# Patient Record
Sex: Female | Born: 1960
Health system: Southern US, Community
[De-identification: ages and names within clinical notes are randomized; demographics above are authoritative.]

## PROBLEM LIST (undated history)

## (undated) DIAGNOSIS — I10 Essential (primary) hypertension: Secondary | ICD-10-CM

## (undated) DIAGNOSIS — Z309 Encounter for contraceptive management, unspecified: Secondary | ICD-10-CM

## (undated) DIAGNOSIS — E669 Obesity, unspecified: Secondary | ICD-10-CM

## (undated) DIAGNOSIS — T7840XA Allergy, unspecified, initial encounter: Secondary | ICD-10-CM

## (undated) HISTORY — DX: Encounter for contraceptive management, unspecified: Z30.9

## (undated) HISTORY — DX: Essential (primary) hypertension: I10

## (undated) HISTORY — DX: Allergy, unspecified, initial encounter: T78.40XA

## (undated) HISTORY — DX: Obesity, unspecified: E66.9

---

## 2001-01-16 ENCOUNTER — Other Ambulatory Visit: Admission: RE | Admit: 2001-01-16 | Discharge: 2001-01-16 | Payer: Self-pay | Admitting: Obstetrics and Gynecology

## 2007-07-13 ENCOUNTER — Ambulatory Visit (HOSPITAL_COMMUNITY): Admission: RE | Admit: 2007-07-13 | Discharge: 2007-07-13 | Payer: Self-pay | Admitting: Obstetrics and Gynecology

## 2009-07-15 ENCOUNTER — Ambulatory Visit (HOSPITAL_COMMUNITY): Admission: RE | Admit: 2009-07-15 | Discharge: 2009-07-15 | Payer: Self-pay | Admitting: Obstetrics & Gynecology

## 2013-01-12 ENCOUNTER — Ambulatory Visit (INDEPENDENT_AMBULATORY_CARE_PROVIDER_SITE_OTHER): Payer: BC Managed Care – PPO | Admitting: Adult Health

## 2013-01-12 ENCOUNTER — Other Ambulatory Visit (HOSPITAL_COMMUNITY)
Admission: RE | Admit: 2013-01-12 | Discharge: 2013-01-12 | Disposition: A | Discharge status: Home / Self Care | Payer: BC Managed Care – PPO | Source: Ambulatory Visit | Attending: Adult Health | Admitting: Adult Health

## 2013-01-12 ENCOUNTER — Encounter (INDEPENDENT_AMBULATORY_CARE_PROVIDER_SITE_OTHER): Payer: Self-pay

## 2013-01-12 ENCOUNTER — Encounter: Payer: Self-pay | Admitting: Adult Health

## 2013-01-12 VITALS — BP 140/90 | HR 78 | Ht 66.0 in | Wt 258.0 lb

## 2013-01-12 DIAGNOSIS — R87810 Cervical high risk human papillomavirus (HPV) DNA test positive: Secondary | ICD-10-CM | POA: Insufficient documentation

## 2013-01-12 DIAGNOSIS — Z124 Encounter for screening for malignant neoplasm of cervix: Secondary | ICD-10-CM | POA: Insufficient documentation

## 2013-01-12 DIAGNOSIS — Z1151 Encounter for screening for human papillomavirus (HPV): Secondary | ICD-10-CM | POA: Insufficient documentation

## 2013-01-12 DIAGNOSIS — Z01419 Encounter for gynecological examination (general) (routine) without abnormal findings: Secondary | ICD-10-CM

## 2013-01-12 DIAGNOSIS — Z309 Encounter for contraceptive management, unspecified: Secondary | ICD-10-CM

## 2013-01-12 DIAGNOSIS — Z1212 Encounter for screening for malignant neoplasm of rectum: Secondary | ICD-10-CM

## 2013-01-12 DIAGNOSIS — I10 Essential (primary) hypertension: Secondary | ICD-10-CM

## 2013-01-12 HISTORY — DX: Encounter for contraceptive management, unspecified: Z30.9

## 2013-01-12 LAB — HEMOCCULT GUIAC POC 1CARD (OFFICE): Fecal Occult Blood, POC: NEGATIVE

## 2013-01-12 MED ORDER — NORETHIN-ETH ESTRAD TRIPHASIC 0.5/0.75/1-35 MG-MCG PO TABS: 1.0000 | ORAL_TABLET | Freq: Every day | ORAL | Status: DC

## 2013-01-12 MED ORDER — HYDROCHLOROTHIAZIDE 12.5 MG PO CAPS: 12.5000 mg | ORAL_CAPSULE | Freq: Every day | ORAL | Status: DC

## 2013-01-12 NOTE — Progress Notes (Signed)
Patient ID: Toni Beard, female   DOB: 08-23-1960, 52 y.o.   MRN: [id number] History of Present Illness: Toni Beard is a 52 year old white female in for pap and physical, she has been having in [place]. Has missed several periods, on OCs.  Current Medications, Allergies, Past Medical History, Past Surgical History, Family History and Social History were reviewed in [employer] record.     Review of Systems: Patient denies any headaches, blurred vision, shortness of breath, chest pain, abdominal pain, problems with bowel movements, urination, or intercourse. No joint pain or mood swings    Physical Exam:BP 140/90  Pulse 78  Ht 5\' 6"  (1.676 m)  Wt 258 lb (117.028 kg)  BMI 41.66 kg/m2 General:  Well developed, well nourished, no acute distress Skin:  Warm and dry Neck:  Midline trachea, normal thyroid Lungs; Clear to auscultation bilaterally Breast:  No dominant palpable mass, retraction, or nipple discharge,has area of folliculitis  Cardiovascular: Regular rate and rhythm Abdomen:  Soft, non tender, no hepatosplenomegaly Pelvic:  External genitalia is normal in appearance, except she has multiple sebaceous cysts in labia.  The vagina is normal in appearance.  The cervix is bulbous.Pap with HPV performed.  Uterus is felt to be normal size, shape, and contour.  No  adnexal masses or tenderness noted. Rectal: Good sphincter tone, no polyps, or hemorrhoids felt.  Hemoccult negative. Extremities:  No swelling or varicosities noted Psych:  No mood changes, alert and cooperative,seems happy   Impression: Yearly gyn exam Hypertension Contraceptive management   Plan: Refilled Triphasil x 6 Refilled Microzide 12.5 mg #90 with 4 refills Physical in 1 year Mammogram yearly Colonoscopy now advised Labs in near future Stop OCs at 52 and stay off x 1 month and check [facility]

## 2013-01-12 NOTE — Patient Instructions (Addendum)
Physical in 1 year Mammogram yearly Colonoscopy now Labs in near future Stop pills at 53 stay off 1 month then come in to check [facility]

## 2013-01-22 ENCOUNTER — Telehealth: Payer: Self-pay | Admitting: Adult Health

## 2013-01-22 NOTE — Telephone Encounter (Signed)
Pt aware of abnormal pap and +HPV and need for colpo

## 2013-01-30 ENCOUNTER — Other Ambulatory Visit: Payer: Self-pay | Admitting: Obstetrics & Gynecology

## 2013-01-30 ENCOUNTER — Ambulatory Visit (INDEPENDENT_AMBULATORY_CARE_PROVIDER_SITE_OTHER): Payer: BC Managed Care – PPO | Admitting: Obstetrics & Gynecology

## 2013-01-30 ENCOUNTER — Encounter: Payer: Self-pay | Admitting: Obstetrics & Gynecology

## 2013-01-30 VITALS — BP 122/80 | Ht 66.0 in | Wt 252.0 lb

## 2013-01-30 DIAGNOSIS — R87810 Cervical high risk human papillomavirus (HPV) DNA test positive: Secondary | ICD-10-CM

## 2013-01-30 DIAGNOSIS — N870 Mild cervical dysplasia: Secondary | ICD-10-CM

## 2013-01-30 DIAGNOSIS — R87610 Atypical squamous cells of undetermined significance on cytologic smear of cervix (ASC-US): Secondary | ICD-10-CM

## 2013-01-30 NOTE — Progress Notes (Signed)
Patient ID: Toni Beard, female   DOB: 01-02-1961, 52 y.o.   MRN: [id number] Pap 01/22/2013   ASC-H  +HPV HR Non smoker No new sexual partners Last pap 18 months ago No history of abnormal pap  Colposcopy Anterior cervix with dense acetowhite epithelium with punctation and mosaicism  Biopsy taken follow up in 1 week

## 2013-02-06 ENCOUNTER — Encounter: Payer: Self-pay | Admitting: Obstetrics & Gynecology

## 2013-02-06 ENCOUNTER — Ambulatory Visit (INDEPENDENT_AMBULATORY_CARE_PROVIDER_SITE_OTHER): Payer: BC Managed Care – PPO | Admitting: Obstetrics & Gynecology

## 2013-02-06 VITALS — BP 110/80 | Wt 251.0 lb

## 2013-02-06 DIAGNOSIS — D069 Carcinoma in situ of cervix, unspecified: Secondary | ICD-10-CM | POA: Insufficient documentation

## 2013-02-06 DIAGNOSIS — N871 Moderate cervical dysplasia: Secondary | ICD-10-CM

## 2013-02-06 NOTE — Progress Notes (Signed)
Patient ID: Toni Beard, female   DOB: [birth year], 52 y.o.   MRN: [id number] Pap 01/22/2013 ASC-H +HPV HR  Non smoker  No new sexual partners  Last pap 18 months ago  No history of abnormal pap  Colposcopy  Anterior cervix with dense acetowhite epithelium with punctation and mosaicism  Biopsy taken follow up in 1 week  Biopsy has returned as high grade dysplasia Will proceed with laser conization

## 2013-02-28 ENCOUNTER — Encounter (HOSPITAL_COMMUNITY): Payer: Self-pay | Admitting: Pharmacy Technician

## 2013-03-06 NOTE — Patient Instructions (Signed)
Your procedure is scheduled on: 03/14/2013  [facility] at  6:15   AM.  Call this number if you have problems the morning of surgery: [phone]   Remember:   Do not drink or eat food:After Midnight.  :  Take these medicines the morning of surgery with A SIP OF WATER: Microzide   Do not wear jewelry, make-up or nail polish.  Do not wear lotions, powders, or perfumes. You may wear deodorant.  Do not shave 48 hours prior to surgery. Men may shave face and neck.  Do not bring valuables to the hospital.  Contacts, dentures or bridgework may not be worn into surgery.  Leave suitcase in the car. After surgery it may be brought to your room.  For patients admitted to the hospital, checkout time is 11:00 AM the day of discharge.   Patients discharged the day of surgery will not be allowed to drive home.    Special Instructions: Shower using CHG 2 nights before surgery and the night before surgery.  If you shower the day of surgery use CHG.  Use special wash - you have one bottle of CHG for all showers.  You should use approximately 1/3 of the bottle for each shower.   Please read over the following fact sheets that you were given: Pain Booklet, MRSA Information, Surgical Site Infection Prevention and Care and Recovery After Surgery  Conization of the Cervix, Care After Refer to this sheet in the next few weeks. These instructions provide you with information on caring for yourself after your procedure. Your health care provider may also give you more specific instructions. Your treatment has been planned according to current medical practices but problems sometimes occur. Call your health care provider if you have any problems or questions after your procedure. WHAT TO EXPECT AFTER THE PROCEDURE After your procedure, it is typical to have the following sensations:  If you had a general anesthetic, you may be groggy for 2 3 hours after the procedure.  You may have cramps (similar to  menstrual cramps) for about 1 week.   You may have a bloody discharge or light to moderate bleeding for 1 2 weeks. The bleeding should not be heavy (for example, it should not soak 1 pad in less than 1 hour).  You may have a black vaginal discharge that looks similar to coffee grounds. This is from the paste that was applied to the cervix to control bleeding. This is normal. Recovery may take up to 3 weeks.  HOME CARE INSTRUCTIONS   Arrange for someone to drive you home after the procedure.  Only take medicines as directed by your health care provider. Do not take aspirin. It can cause bleeding.   Take showers for the first week. Do not take baths, swim, or use hot tubs until your health care provider says it is OK.   Do not douche, use tampons, or have sexual intercourse until your health care provider says it is OK.   Avoid strenuous activities, exercises, and heavy lifting for at least 7 14 days.  You may resume your normal diet unless your health care provider advises you differently.    If you are constipated, you may:  Take a mild laxative as directed by your health care provider.   Add fruit and bran to your diet.   Make sure to drink enough fluids to keep your urine clear or pale yellow.  Keep follow-up appointments with your health care provider. SEEK  MEDICAL CARE IF:   You develop a rash.   You are dizzy or lightheaded.   You feel nauseous.   You develop a bad smelling vaginal discharge. SEEK IMMEDIATE MEDICAL CARE IF:   You have blood clots or bleeding that is heavier than a normal menstrual period (for example, soaking a pad in less than 1 hour) or you develop bright red bleeding.   You have a fever over 101F (38.3C) or persistent symptoms for more than 2 3 days.   You have a fever over 101F (38.3C) and your symptoms suddenly get worse.  You have increasing cramps.   You faint.   You have pain when urinating.  You have bloody urine.    You start vomiting.   Your pain is not relieved with your medicine.   Your have severe or worsening pain. Document Released: 02/08/2005 Document Revised: 10/11/2012 Document Reviewed: 08/04/2012 [facility] Patient Information 2014 [place], [place]. PATIENT INSTRUCTIONS POST-ANESTHESIA  IMMEDIATELY FOLLOWING SURGERY:  Do not drive or operate machinery for the first twenty four hours after surgery.  Do not make any important decisions for twenty four hours after surgery or while taking narcotic pain medications or sedatives.  If you develop intractable nausea and vomiting or a severe headache please notify your doctor immediately.  FOLLOW-UP:  Please make an appointment with your surgeon as instructed. You do not need to follow up with anesthesia unless specifically instructed to do so.  WOUND CARE INSTRUCTIONS (if applicable):  Keep a dry clean dressing on the anesthesia/puncture wound site if there is drainage.  Once the wound has quit draining you may leave it open to air.  Generally you should leave the bandage intact for twenty four hours unless there is drainage.  If the epidural site drains for more than 36-48 hours please call the anesthesia department.  QUESTIONS?:  Please feel free to call your physician or the hospital operator if you have any questions, and they will be happy to assist you.

## 2013-03-07 ENCOUNTER — Encounter (HOSPITAL_COMMUNITY): Payer: Self-pay

## 2013-03-07 ENCOUNTER — Encounter (HOSPITAL_COMMUNITY)
Admission: RE | Admit: 2013-03-07 | Discharge: 2013-03-07 | Disposition: A | Discharge status: Home / Self Care | Payer: BC Managed Care – PPO | Source: Ambulatory Visit | Attending: Obstetrics & Gynecology | Admitting: Obstetrics & Gynecology

## 2013-03-07 DIAGNOSIS — Z01810 Encounter for preprocedural cardiovascular examination: Secondary | ICD-10-CM | POA: Insufficient documentation

## 2013-03-07 DIAGNOSIS — Z01812 Encounter for preprocedural laboratory examination: Secondary | ICD-10-CM | POA: Insufficient documentation

## 2013-03-07 DIAGNOSIS — Z01818 Encounter for other preprocedural examination: Secondary | ICD-10-CM | POA: Insufficient documentation

## 2013-03-07 LAB — COMPREHENSIVE METABOLIC PANEL
ALT: 9 U/L (ref 0–35)
AST: 13 U/L (ref 0–37)
Albumin: 3 g/dL — ABNORMAL LOW (ref 3.5–5.2)
Alkaline Phosphatase: 85 U/L (ref 39–117)
BILIRUBIN TOTAL: 0.3 mg/dL (ref 0.3–1.2)
BUN: 12 mg/dL (ref 6–23)
CALCIUM: 8.9 mg/dL (ref 8.4–10.5)
CHLORIDE: 103 meq/L (ref 96–112)
CO2: 27 meq/L (ref 19–32)
Creatinine, Ser: 0.77 mg/dL (ref 0.50–1.10)
GFR calc Af Amer: >90 mL/min (ref >90)
GFR calc non Af Amer: >90 mL/min (ref >90)
Glucose, Bld: 131 mg/dL — ABNORMAL HIGH (ref 70–99)
Potassium: 3.4 mEq/L — ABNORMAL LOW (ref 3.7–5.3)
SODIUM: 144 meq/L (ref 137–147)
TOTAL PROTEIN: 6.4 g/dL (ref 6.0–8.3)

## 2013-03-07 LAB — URINE MICROSCOPIC-ADD ON

## 2013-03-07 LAB — URINALYSIS, ROUTINE W REFLEX MICROSCOPIC
Bilirubin Urine: NEGATIVE
GLUCOSE, UA: NEGATIVE mg/dL
HGB URINE DIPSTICK: NEGATIVE
Ketones, ur: NEGATIVE mg/dL
NITRITE: NEGATIVE
PH: 6 (ref 5.0–8.0)
PROTEIN: NEGATIVE mg/dL
SPECIFIC GRAVITY, URINE: 1.025 (ref 1.005–1.030)
UROBILINOGEN UA: 0.2 mg/dL (ref 0.0–1.0)

## 2013-03-07 LAB — CBC
HCT: 38.2 % (ref 36.0–46.0)
HEMOGLOBIN: 13.3 g/dL (ref 12.0–15.0)
MCH: 31.5 pg (ref 26.0–34.0)
MCHC: 34.8 g/dL (ref 30.0–36.0)
MCV: 90.5 fL (ref 78.0–100.0)
Platelets: 272 10*3/uL (ref 150–400)
RBC: 4.22 MIL/uL (ref 3.87–5.11)
RDW: 12.9 % (ref 11.5–15.5)
WBC: 7.8 10*3/uL (ref 4.0–10.5)

## 2013-03-07 LAB — HCG, QUANTITATIVE, PREGNANCY: hCG, Beta Chain, Quant, S: 1 m[IU]/mL (ref <5)

## 2013-03-14 ENCOUNTER — Ambulatory Visit (HOSPITAL_COMMUNITY): Payer: BC Managed Care – PPO | Admitting: Anesthesiology

## 2013-03-14 ENCOUNTER — Encounter (HOSPITAL_COMMUNITY): Payer: Self-pay | Admitting: *Deleted

## 2013-03-14 ENCOUNTER — Encounter (HOSPITAL_COMMUNITY)
Admission: RE | Disposition: A | Discharge status: Home / Self Care | Payer: Self-pay | Source: Ambulatory Visit | Attending: Obstetrics & Gynecology

## 2013-03-14 ENCOUNTER — Encounter (HOSPITAL_COMMUNITY): Payer: BC Managed Care – PPO | Admitting: Anesthesiology

## 2013-03-14 ENCOUNTER — Ambulatory Visit (HOSPITAL_COMMUNITY)
Admission: RE | Admit: 2013-03-14 | Discharge: 2013-03-14 | Disposition: A | Discharge status: Home / Self Care | Payer: BC Managed Care – PPO | Source: Ambulatory Visit | Attending: Obstetrics & Gynecology | Admitting: Obstetrics & Gynecology

## 2013-03-14 DIAGNOSIS — N871 Moderate cervical dysplasia: Secondary | ICD-10-CM

## 2013-03-14 DIAGNOSIS — D069 Carcinoma in situ of cervix, unspecified: Secondary | ICD-10-CM | POA: Insufficient documentation

## 2013-03-14 DIAGNOSIS — I10 Essential (primary) hypertension: Secondary | ICD-10-CM | POA: Insufficient documentation

## 2013-03-14 DIAGNOSIS — Z9889 Other specified postprocedural states: Secondary | ICD-10-CM

## 2013-03-14 HISTORY — PX: HOLMIUM LASER APPLICATION: SHX5852

## 2013-03-14 HISTORY — PX: CERVICAL CONIZATION W/BX: SHX1330

## 2013-03-14 SURGERY — CONE BIOPSY, CERVIX
Anesthesia: General

## 2013-03-14 MED ORDER — PROPOFOL 10 MG/ML IV BOLUS
INTRAVENOUS | Status: DC | PRN
  Administered 2013-03-14: 150 mg via INTRAVENOUS
  Administered 2013-03-14: 20 mg via INTRAVENOUS

## 2013-03-14 MED ORDER — HYDROCODONE-ACETAMINOPHEN 5-325 MG PO TABS: 1.0000 | ORAL_TABLET | Freq: Four times a day (QID) | ORAL | Status: DC | PRN

## 2013-03-14 MED ORDER — PROPOFOL 10 MG/ML IV BOLUS
INTRAVENOUS | Status: AC
  Filled 2013-03-14: qty 20

## 2013-03-14 MED ORDER — CEFAZOLIN SODIUM-DEXTROSE 2-3 GM-% IV SOLR
2.0000 g | INTRAVENOUS | Status: AC
  Administered 2013-03-14: 2 g via INTRAVENOUS
  Filled 2013-03-14: qty 50

## 2013-03-14 MED ORDER — KETOROLAC TROMETHAMINE 30 MG/ML IJ SOLN
30.0000 mg | Freq: Once | INTRAMUSCULAR | Status: AC
  Administered 2013-03-14: 30 mg via INTRAVENOUS
  Filled 2013-03-14: qty 1

## 2013-03-14 MED ORDER — WATER FOR IRRIGATION, STERILE IR SOLN
Status: DC | PRN
  Administered 2013-03-14: 1000 mL

## 2013-03-14 MED ORDER — ONDANSETRON HCL 4 MG/2ML IJ SOLN
4.0000 mg | Freq: Once | INTRAMUSCULAR | Status: AC
  Administered 2013-03-14: 4 mg via INTRAVENOUS
  Filled 2013-03-14: qty 2

## 2013-03-14 MED ORDER — FENTANYL CITRATE 0.05 MG/ML IJ SOLN
INTRAMUSCULAR | Status: AC
  Filled 2013-03-14: qty 2

## 2013-03-14 MED ORDER — KETOROLAC TROMETHAMINE 10 MG PO TABS: 10.0000 mg | ORAL_TABLET | Freq: Three times a day (TID) | ORAL | Status: DC | PRN

## 2013-03-14 MED ORDER — LIDOCAINE HCL (CARDIAC) 10 MG/ML IV SOLN
INTRAVENOUS | Status: DC | PRN
  Administered 2013-03-14: 10 mg via INTRAVENOUS

## 2013-03-14 MED ORDER — FERRIC SUBSULFATE 259 MG/GM EX SOLN
CUTANEOUS | Status: AC
  Filled 2013-03-14: qty 8

## 2013-03-14 MED ORDER — FENTANYL CITRATE 0.05 MG/ML IJ SOLN
INTRAMUSCULAR | Status: DC | PRN
  Administered 2013-03-14: 50 ug via INTRAVENOUS
  Administered 2013-03-14 (×4): 25 ug via INTRAVENOUS

## 2013-03-14 MED ORDER — LIDOCAINE HCL (PF) 1 % IJ SOLN
INTRAMUSCULAR | Status: AC
  Filled 2013-03-14: qty 5

## 2013-03-14 MED ORDER — MIDAZOLAM HCL 2 MG/2ML IJ SOLN
1.0000 mg | INTRAMUSCULAR | Status: DC | PRN
Start: 2013-03-14 — End: 2013-03-14
  Administered 2013-03-14: 2 mg via INTRAVENOUS

## 2013-03-14 MED ORDER — FENTANYL CITRATE 0.05 MG/ML IJ SOLN: 25.0000 ug | INTRAMUSCULAR | Status: DC | PRN

## 2013-03-14 MED ORDER — GLYCOPYRROLATE 0.2 MG/ML IJ SOLN
0.2000 mg | Freq: Once | INTRAMUSCULAR | Status: AC
  Administered 2013-03-14: 0.2 mg via INTRAVENOUS
  Filled 2013-03-14: qty 1

## 2013-03-14 MED ORDER — FENTANYL CITRATE 0.05 MG/ML IJ SOLN
25.0000 ug | INTRAMUSCULAR | Status: AC
Start: 2013-03-14 — End: 2013-03-14
  Administered 2013-03-14: 25 ug via INTRAVENOUS
  Filled 2013-03-14: qty 2

## 2013-03-14 MED ORDER — ONDANSETRON HCL 4 MG/2ML IJ SOLN
4.0000 mg | Freq: Once | INTRAMUSCULAR | Status: DC | PRN
Start: 2013-03-14 — End: 2013-03-14

## 2013-03-14 MED ORDER — ONDANSETRON HCL 8 MG PO TABS: 8.0000 mg | ORAL_TABLET | Freq: Three times a day (TID) | ORAL | Status: DC | PRN

## 2013-03-14 MED ORDER — FERRIC SUBSULFATE SOLN
Status: DC | PRN
  Administered 2013-03-14: 1

## 2013-03-14 MED ORDER — LACTATED RINGERS IV SOLN
INTRAVENOUS | Status: DC
  Administered 2013-03-14: 07:00:00 via INTRAVENOUS

## 2013-03-14 MED ORDER — MIDAZOLAM HCL 2 MG/2ML IJ SOLN
INTRAMUSCULAR | Status: AC
  Filled 2013-03-14: qty 2

## 2013-03-14 SURGICAL SUPPLY — 28 items
APPLICATOR COTTON TIP 6IN STRL (MISCELLANEOUS) ×2 IMPLANT
BAG HAMPER (MISCELLANEOUS) ×2 IMPLANT
CLOTH BEACON ORANGE TIMEOUT ST (SAFETY) ×2 IMPLANT
COAGULATOR SUCT SWTCH 10FR 6 (ELECTROSURGICAL) ×2 IMPLANT
COVER LIGHT HANDLE STERIS (MISCELLANEOUS) ×4 IMPLANT
ELECT REM PT RETURN 9FT ADLT (ELECTROSURGICAL) ×2
ELECTRODE REM PT RTRN 9FT ADLT (ELECTROSURGICAL) ×1 IMPLANT
FORMALIN 10 PREFIL 120ML (MISCELLANEOUS) ×2 IMPLANT
GAUZE SPONGE 4X4 16PLY XRAY LF (GAUZE/BANDAGES/DRESSINGS) ×2 IMPLANT
GLOVE BIOGEL PI IND STRL 7.0 (GLOVE) ×1 IMPLANT
GLOVE BIOGEL PI IND STRL 8 (GLOVE) ×1 IMPLANT
GLOVE BIOGEL PI INDICATOR 7.0 (GLOVE) ×1
GLOVE BIOGEL PI INDICATOR 8 (GLOVE) ×1
GLOVE ECLIPSE 8.0 STRL XLNG CF (GLOVE) ×2 IMPLANT
GOWN STRL REUS W/TWL LRG LVL3 (GOWN DISPOSABLE) ×2 IMPLANT
KIT ROOM TURNOVER APOR (KITS) ×2 IMPLANT
LASER FIBER DISP 1000U (UROLOGICAL SUPPLIES) ×2 IMPLANT
MANIFOLD NEPTUNE II (INSTRUMENTS) ×2 IMPLANT
MARKER SKIN DUAL TIP RULER LAB (MISCELLANEOUS) ×2 IMPLANT
PACK BASIC III (CUSTOM PROCEDURE TRAY) ×1
PACK PERI GYN (CUSTOM PROCEDURE TRAY) ×2 IMPLANT
PACK SRG BSC III STRL LF ECLPS (CUSTOM PROCEDURE TRAY) ×1 IMPLANT
PAD ARMBOARD 7.5X6 YLW CONV (MISCELLANEOUS) ×2 IMPLANT
PREFILTER SMOKE EVAC (FILTER) ×2 IMPLANT
SET BASIN LINEN APH (SET/KITS/TRAYS/PACK) ×2 IMPLANT
TOWEL OR 17X26 4PK STRL BLUE (TOWEL DISPOSABLE) ×2 IMPLANT
TUBING SMOKE EVAC CO2 (TUBING) ×2 IMPLANT
WATER STERILE IRR 1000ML POUR (IV SOLUTION) ×2 IMPLANT

## 2013-03-14 NOTE — Anesthesia Procedure Notes (Signed)
Procedure Name: LMA Insertion Date/Time: 03/14/2013 7:40 AM Performed by: Franco NonesYATES, Anya Murphey S Pre-anesthesia Checklist: Patient identified, Patient being monitored, Emergency Drugs available, Timeout performed and Suction available Patient Re-evaluated:Patient Re-evaluated prior to inductionOxygen Delivery Method: Circle System Utilized Preoxygenation: Pre-oxygenation with 100% oxygen Intubation Type: IV induction Ventilation: Mask ventilation without difficulty LMA: LMA inserted LMA Size: 4.0 Number of attempts: 1 Placement Confirmation: positive ETCO2 and breath sounds checked- equal and bilateral

## 2013-03-14 NOTE — H&P (Signed)
Preoperative History and Physical  Toni Beard is a 53 y.o. G1P1 with No LMP recorded. Patient is not currently having periods (Reason: Oral contraceptives). admitted for a laser conization of the cervix for high grade dysplasia.  Pap 01/22/2013 ASC-H +HPV HR  Non smoker  No new sexual partners  Last pap 18 months ago  No history of abnormal pap  Colposcopy  Anterior cervix with dense acetowhite epithelium with punctation and mosaicism  Biopsy taken follow up in 1 week  Biopsy has returned as high grade dysplasia  Will proceed with laser conization        PMH:    Past Medical History  Diagnosis Date  . Hypertension   . Obesity   . Contraceptive management 01/12/2013    PSH:    History reviewed. No pertinent past surgical history.  POb/GynH:      OB History   Grav Para Term Preterm Abortions TAB SAB Ect Mult Living   1 1        1       SH:   History  Substance Use Topics  . Smoking status: Former Smoker    Types: Cigarettes    Quit date: 03/07/1978  . Smokeless tobacco: Never Used  . Alcohol Use: No    FH:    Family History  Problem Relation Age of Onset  . Diabetes Mother   . Hypertension Mother   . Heart disease Mother   . Hypertension Sister   . Cancer Sister     kidney,lung  . Hypertension Brother   . Cancer Brother     prostate  . Heart disease Brother      Allergies:  Allergies  Allergen Reactions  . Sulfa Antibiotics Nausea And Vomiting    Medications:      Current facility-administered medications:ceFAZolin (ANCEF) IVPB 2 g/50 mL premix, 2 g, Intravenous, On Call to OR, Lazaro ArmsLuther H Eure, MD;  fentaNYL (SUBLIMAZE) injection 25 mcg, 25 mcg, Intravenous, Q10 min, Laurene FootmanLuis Gonzalez, MD, 25 mcg at 03/14/13 [id number];  glycopyrrolate (ROBINUL) injection 0.2 mg, 0.2 mg, Intravenous, Once, Laurene FootmanLuis Gonzalez, MD;  lactated ringers infusion, , Intravenous, Continuous, Laurene FootmanLuis Gonzalez, MD, Last Rate: 75 mL/hr at 03/14/13 [id number] midazolam (VERSED) injection 1-2 mg, 1-2  mg, Intravenous, Q5 Min x 3 PRN, Laurene FootmanLuis Gonzalez, MD, 2 mg at 03/14/13 [id number];  ondansetron [facility](ZOFRAN) injection 4 mg, 4 mg, Intravenous, Once, Laurene FootmanLuis Gonzalez, MD  Review of Systems:   Review of Systems  Constitutional: Negative for fever, chills, weight loss, malaise/fatigue and diaphoresis.  HENT: Negative for hearing loss, ear pain, nosebleeds, congestion, sore throat, neck pain, tinnitus and ear discharge.   Eyes: Negative for blurred vision, double vision, photophobia, pain, discharge and redness.  Respiratory: Negative for cough, hemoptysis, sputum production, shortness of breath, wheezing and stridor.   Cardiovascular: Negative for chest pain, palpitations, orthopnea, claudication, leg swelling and PND.  Gastrointestinal: Negative for abdominal pain. Negative for heartburn, nausea, vomiting, diarrhea, constipation, blood in stool and melena.  Genitourinary: Negative for dysuria, urgency, frequency, hematuria and flank pain.  Musculoskeletal: Negative for myalgias, back pain, joint pain and falls.  Skin: Negative for itching and rash.  Neurological: Negative for dizziness, tingling, tremors, sensory change, speech change, focal weakness, seizures, loss of consciousness, weakness and headaches.  Endo/Heme/Allergies: Negative for environmental allergies and polydipsia. Does not bruise/bleed easily.  Psychiatric/Behavioral: Negative for depression, suicidal ideas, hallucinations, memory loss and substance abuse. The patient is not nervous/anxious and does not have insomnia.      PHYSICAL  EXAM:  Pulse 93, temperature 97.4 F (36.3 C), temperature source Oral, resp. rate 18, height 5\' 6"  (1.676 m), weight 257 lb (116.574 kg), SpO2 100.00%.    Vitals reviewed. Constitutional: She is oriented to person, place, and time. She appears well-developed and well-nourished.  HENT:  Head: Normocephalic and atraumatic.  Right Ear: External ear normal.  Left Ear: External ear normal.  Nose: Nose normal.   Mouth/Throat: Oropharynx is clear and moist.  Eyes: Conjunctivae and EOM are normal. Pupils are equal, round, and reactive to light. Right eye exhibits no discharge. Left eye exhibits no discharge. No scleral icterus.  Neck: Normal range of motion. Neck supple. No tracheal deviation present. No thyromegaly present.  Cardiovascular: Normal rate, regular rhythm, normal heart sounds and intact distal pulses.  Exam reveals no gallop and no friction rub.   No murmur heard. Respiratory: Effort normal and breath sounds normal. No respiratory distress. She has no wheezes. She has no rales. She exhibits no tenderness.  GI: Soft. Bowel sounds are normal. She exhibits no distension and no mass. There is tenderness. There is no rebound and no guarding.  Genitourinary:       Vulva is normal without lesions Vagina is pink moist without discharge Cervix normal in appearance and pap is normal Uterus is normal Adnexa is negative with normal sized ovaries Musculoskeletal: Normal range of motion. She exhibits no edema and no tenderness.  Neurological: She is alert and oriented to person, place, and time. She has normal reflexes. She displays normal reflexes. No cranial nerve deficit. She exhibits normal muscle tone. Coordination normal.  Skin: Skin is warm and dry. No rash noted. No erythema. No pallor.  Psychiatric: She has a normal mood and affect. Her behavior is normal. Judgment and thought content normal.    Labs: Results for orders placed during the hospital encounter of 03/07/13 (from the past 336 hour(s))  URINALYSIS, ROUTINE W REFLEX MICROSCOPIC   Collection Time    03/07/13 10:04 AM      Result Value Range   Color, Urine YELLOW  YELLOW   APPearance CLEAR  CLEAR   Specific Gravity, Urine 1.025  1.005 - 1.030   pH 6.0  5.0 - 8.0   Glucose, UA NEGATIVE  NEGATIVE mg/dL   Hgb urine dipstick NEGATIVE  NEGATIVE   Bilirubin Urine NEGATIVE  NEGATIVE   Ketones, ur NEGATIVE  NEGATIVE mg/dL   Protein,  ur NEGATIVE  NEGATIVE mg/dL   Urobilinogen, UA 0.2  0.0 - 1.0 mg/dL   Nitrite NEGATIVE  NEGATIVE   Leukocytes, UA TRACE (*) NEGATIVE  URINE MICROSCOPIC-ADD ON   Collection Time    03/07/13 10:04 AM      Result Value Range   Squamous Epithelial / LPF RARE  RARE   WBC, UA 0-2  <3 WBC/hpf  CBC   Collection Time    03/07/13 10:21 AM      Result Value Range   WBC 7.8  4.0 - 10.5 K/uL   RBC 4.22  3.87 - 5.11 MIL/uL   Hemoglobin 13.3  12.0 - 15.0 g/dL   HCT 96.0  45.4 - 09.8 %   MCV 90.5  78.0 - 100.0 fL   MCH 31.5  26.0 - 34.0 pg   MCHC 34.8  30.0 - 36.0 g/dL   RDW 11.9  14.7 - 82.9 %   Platelets 272  150 - 400 K/uL  COMPREHENSIVE METABOLIC PANEL   Collection Time    03/07/13 10:21 AM  Result Value Range   Sodium 144  137 - 147 mEq/L   Potassium 3.4 (*) 3.7 - 5.3 mEq/L   Chloride 103  96 - 112 mEq/L   CO2 27  19 - 32 mEq/L   Glucose, Bld 131 (*) 70 - 99 mg/dL   BUN 12  6 - 23 mg/dL   Creatinine, Ser 1.61  0.50 - 1.10 mg/dL   Calcium 8.9  8.4 - 09.6 mg/dL   Total Protein 6.4  6.0 - 8.3 g/dL   Albumin 3.0 (*) 3.5 - 5.2 g/dL   AST 13  0 - 37 U/L   ALT 9  0 - 35 U/L   Alkaline Phosphatase 85  39 - 117 U/L   Total Bilirubin 0.3  0.3 - 1.2 mg/dL   GFR calc non Af Amer >90  >90 mL/min   GFR calc Af Amer >90  >90 mL/min  HCG, QUANTITATIVE, PREGNANCY   Collection Time    03/07/13 10:21 AM      Result Value Range   hCG, Beta Chain, Quant, S 1  <5 mIU/mL    EKG: Orders placed during the hospital encounter of 03/14/13  . EKG 12-LEAD  . EKG 12-LEAD    Imaging Studies: No results found.    Assessment: Patient Active Problem List   Diagnosis Date Noted  . Carcinoma in situ of cervix uteri 02/06/2013  . Hypertension 01/12/2013  . Contraceptive management 01/12/2013    Plan: Laser conization of the cervix  EURE,LUTHER H 03/14/2013 7:26 AM

## 2013-03-14 NOTE — Anesthesia Preprocedure Evaluation (Signed)
Anesthesia Evaluation  Patient identified by MRN, date of birth, ID band Patient awake    Reviewed: Allergy & Precautions, H&P , NPO status , Patient's Chart, lab work & pertinent test results  Airway Mallampati: II TM Distance: >3 FB Neck ROM: Full    Dental  (+) Teeth Intact   Pulmonary neg pulmonary ROS, former smoker,  breath sounds clear to auscultation        Cardiovascular hypertension, Pt. on medications Rhythm:Regular Rate:Normal     Neuro/Psych    GI/Hepatic negative GI ROS,   Endo/Other    Renal/GU      Musculoskeletal   Abdominal   Peds  Hematology   Anesthesia Other Findings   Reproductive/Obstetrics                           Anesthesia Physical Anesthesia Plan  ASA: II  Anesthesia Plan: General   Post-op Pain Management:    Induction: Intravenous  Airway Management Planned: LMA  Additional Equipment:   Intra-op Plan:   Post-operative Plan: Extubation in OR  Informed Consent: I have reviewed the patients History and Physical, chart, labs and discussed the procedure including the risks, benefits and alternatives for the proposed anesthesia with the patient or authorized representative who has indicated his/her understanding and acceptance.     Plan Discussed with:   Anesthesia Plan Comments:         Anesthesia Quick Evaluation

## 2013-03-14 NOTE — Discharge Instructions (Signed)
Conization of the Cervix Cervical conization is the cutting (excision) of a cone-shaped portion of the cervix. The procedure is performed through the vagina in either your health care provider's office or an operating room. This procedure is usually done when there is abnormal bleeding from the cervix. It can also be done to evaluate an abnormal Pap test or if an abnormality is seen on the cervix during an exam. The tissue is then examined to see if there are precancerous cells or cancer present.  Conization of the cervix is not done during a menstrual period or pregnancy.  LET YOUR HEALTH CARE PROVIDER KNOW ABOUT:  Any allergies you have.   All medicines you are taking, including vitamins, herbs, eye drops, creams, and over-the-counter medicines.   Previous problems you or members of your family have had with the use of anesthetics.   Any blood disorders you have.   Previous surgeries you have had.   Medical conditions you have.   Your smoking habits.   The possibility of being pregnant.  RISKS AND COMPLICATIONS  Generally, conization of the cervix is a safe procedure. However, as with any procedure, complications can occur. Possible complications include:  Heavy bleeding several days or weeks after the procedure. Light bleeding or spotting after the procedure is normal.  Infection (rare).  Damage to the cervix or surrounding organs (uncommon).   Problems with the anesthesia.   Increased risk of preterm labor in future pregnancies. BEFORE THE PROCEDURE  Do not eat or drink anything for 6 8 hours before the procedure.   Do not take aspirin or blood thinners for at least a week before the procedure or as directed by your health care provider.   Arrange for someone to take you home after the procedure.  PROCEDURE There are three different methods to perform conization of the cervix. These include:   The cold knife method In this method a small cone-shaped sample  of tissue is cut out with a knife (scalpel) from the cervical canal and the transformation zone (where the normal cells end and the abnormal cells begin).   The LEEP method In this method a small cone-shaped sample of tissue is cut out with a thin wire that can burn (cauterize) the cervical tissue with an electrical current.   Laser treatment In this method a small cone-shaped sample of tissue is cut out and then cauterized with a laser beam to prevent bleeding.  The procedure will be performed as follows:   Depending on the method, you will either be given a medicine to make you sleep (general anesthetic) or a numbing medicine (local anesthetic). A medicine that numbs the cervix (cervical block) may be given.   A lubricated device called a speculum will be inserted into the vagina to spread open the walls of the vagina. This will help your health care provider see the inside of the vagina and cervix better.   The tissue from the cervix will be removed and examined.   The results of the procedure will help your health care provider decide if further treatment is necessary. They will also help your health care provider decide on the best treatment if your results are abnormal. AFTER THE PROCEDURE  If you had a general anesthetic, you may be groggy for 2 3 hours after the procedure.   If you had a local anesthetic, you will rest at the clinic or hospital until you are stable and feel ready to go home.   Recovery may   take up to 3 weeks.   You may have some cramping for about 1 week.   You may have bloody discharge or light bleeding for 1 2 weeks.   You may have black discharge coming from the vagina. This is from the paste used on the cervix to prevent bleeding. This is normal discharge.  Document Released: 11/18/2004 Document Revised: 10/11/2012 Document Reviewed: 08/04/2012 [employer] Patient Information 2014 [employer]  

## 2013-03-14 NOTE — Op Note (Signed)
Preoperative diagnosis:  1.  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  However her last Pap returned as atypical squamous cells cannot rule out high-grade dysplasia lesion.  I performed a colposcopy in the office and found a very large cervical lesion.  A biopsy returned revealing high-grade dysplasia with involvement of the underlying endocervical glands.  As a result we decided to proceed with a conization of the cervix for both therapeutic and diagnostic reasons.  Also the lesion was quite large and extensive.  Today at the time of surgery a repeat colposcopy was performed using 3% acetic acid and the lesion was once again confirmed.  There  were no new findings today.  Description of operation:  Patient was taken to the operating room and placed in the supine position where she underwent laryngeal mask airway anesthesia.  She was then placed in the high lithotomy position using candy cane stirrups.  She was then draped out for a laser procedure.  The microscope was used and 3% acetic acid was placed on the cervix.  The holmium laser was then employed at a power of 2.5 and rates between 8 and 15.  I achieved a couple millimeter margin around lesions both at 12:00 and 6:00 the laser was used to perform a conization.  The specimen was removed and sent to pathology for evaluation.  As is always the case with laser I did achieve at an appropriate margin around the disease with shrinkage of the tissue during the procedure it may appear to be a positive lateral margin.  However the surgical margin is indeed clear.  I then used the laser to ablate the conization bed to a  depth of 5-7 mm laterally coning down to 9 mm centrally and began getting good surgical margin.  Additional hemostasis was achieved using Monsel solution.  In the conization bed was completely hemostatic.  Blood loss for the procedure was none.  The patient received 2 grams Ancef and toradol  The patient was awakened from anesthesia and taken to the recovery room in good stable condition with all counts being correct.  She will be followed up in the office in one month for evaluation of the conization bed.  EURE,LUTHER H 03/14/2013 8:42 AM

## 2013-03-14 NOTE — Transfer of Care (Signed)
Immediate Anesthesia Transfer of Care Note  Patient: Toni Beard  Procedure(s) Performed: Procedure(s) (LRB): CONIZATION CERVIX  (N/A) HOLMIUM LASER APPLICATION (N/A)  Patient Location: PACU  Anesthesia Type: General  Level of Consciousness: awake  Airway & Oxygen Therapy: Patient Spontanous Breathing and non-rebreather face mask  Post-op Assessment: Report given to PACU RN, Post -op Vital signs reviewed and stable and Patient moving all extremities  Post vital signs: Reviewed and stable  Complications: No apparent anesthesia complications

## 2013-03-14 NOTE — Anesthesia Postprocedure Evaluation (Signed)
Anesthesia Post Note  Patient: Toni Beard  Procedure(s) Performed: Procedure(s) (LRB): CONIZATION CERVIX  (N/A) HOLMIUM LASER APPLICATION (N/A)  Anesthesia type: General  Patient location: PACU  Post pain: Pain level controlled  Post assessment: Post-op Vital signs reviewed, Patient's Cardiovascular Status Stable, Respiratory Function Stable, Patent Airway, No signs of Nausea or vomiting and Pain level controlled  Last Vitals:  Filed Vitals:   03/14/13 0840  BP: 106/83  Pulse: 101  Temp: 36.5 C  Resp: 14    Post vital signs: Reviewed and stable  Level of consciousness: awake and alert   Complications: No apparent anesthesia complications

## 2013-03-15 ENCOUNTER — Encounter (HOSPITAL_COMMUNITY): Payer: Self-pay | Admitting: Obstetrics & Gynecology

## 2013-03-21 ENCOUNTER — Encounter: Payer: Self-pay | Admitting: Obstetrics & Gynecology

## 2013-03-21 ENCOUNTER — Ambulatory Visit (INDEPENDENT_AMBULATORY_CARE_PROVIDER_SITE_OTHER): Payer: Self-pay | Admitting: Obstetrics & Gynecology

## 2013-03-21 VITALS — BP 150/90 | Wt 253.0 lb

## 2013-03-21 DIAGNOSIS — Z9889 Other specified postprocedural states: Secondary | ICD-10-CM

## 2013-03-21 DIAGNOSIS — D069 Carcinoma in situ of cervix, unspecified: Secondary | ICD-10-CM

## 2013-03-21 NOTE — Progress Notes (Signed)
Patient ID: Wende CreaseSusan J Gutter, female   DOB: 19-Mar-1960, 53 y.o.   MRN: [id number] POD #8 laser conization of the cervix  patholgy HSIL with endocerivcal glandular involvement margins are clear  Exam Normal post op monsel's placed  Follow up pap in 6 months

## 2013-09-19 ENCOUNTER — Other Ambulatory Visit: Payer: BC Managed Care – PPO | Admitting: Obstetrics & Gynecology

## 2013-09-24 ENCOUNTER — Other Ambulatory Visit (HOSPITAL_COMMUNITY)
Admission: RE | Admit: 2013-09-24 | Discharge: 2013-09-24 | Disposition: A | Discharge status: Home / Self Care | Payer: BC Managed Care – PPO | Source: Ambulatory Visit | Attending: Obstetrics & Gynecology | Admitting: Obstetrics & Gynecology

## 2013-09-24 ENCOUNTER — Ambulatory Visit (INDEPENDENT_AMBULATORY_CARE_PROVIDER_SITE_OTHER): Payer: BC Managed Care – PPO | Admitting: Obstetrics & Gynecology

## 2013-09-24 ENCOUNTER — Encounter: Payer: Self-pay | Admitting: Obstetrics & Gynecology

## 2013-09-24 VITALS — BP 122/78 | Ht 66.0 in | Wt 251.5 lb

## 2013-09-24 DIAGNOSIS — N871 Moderate cervical dysplasia: Secondary | ICD-10-CM

## 2013-09-24 DIAGNOSIS — Z01419 Encounter for gynecological examination (general) (routine) without abnormal findings: Secondary | ICD-10-CM | POA: Diagnosis present

## 2013-09-24 NOTE — Progress Notes (Signed)
Patient ID: Toni CreaseSusan J Beard, female   DOB: 1960/04/28, 53 y.o.   MRN: [id number] Chief Complaint  Patient presents with  . Repeat pap    03/14/13 cervix cone moderate dysplasia, bloodwork for menapause    HPI Status post laser conization of cervix 02/2013 Pathology:  HSIL with endocerviacal glandular involvement  ROS No burning with urination, frequency or urgency No nausea, vomiting or diarrhea Nor fever chills or other constitutional symptoms   Blood pressure 122/78, height 5\' 6"  (1.676 m), weight 251 lb 8 oz (114.08 kg).  EXAM Abdomen:      soft, nontender Vulva:            normal appearing vulva with no masses, tenderness or lesions Vagina:          normal mucosa, no discharge Cervix:           normal appearance, thin prep PAP obtained and s/p laser conization Uterus:           Adnexa:          Rectal:            Hemoccult:                               Assessment/Plan:  HSIL s/p laser conization of cervix  Repeat pap in 6 months

## 2013-09-26 LAB — CYTOLOGY - PAP

## 2013-12-24 ENCOUNTER — Encounter: Payer: Self-pay | Admitting: Obstetrics & Gynecology

## 2014-03-01 ENCOUNTER — Encounter: Payer: Self-pay | Admitting: Family Medicine

## 2014-03-01 DIAGNOSIS — J302 Other seasonal allergic rhinitis: Secondary | ICD-10-CM | POA: Insufficient documentation

## 2014-05-17 ENCOUNTER — Encounter: Payer: Self-pay | Admitting: Family Medicine

## 2014-05-17 ENCOUNTER — Ambulatory Visit (INDEPENDENT_AMBULATORY_CARE_PROVIDER_SITE_OTHER): Payer: 59 | Admitting: Family Medicine

## 2014-05-17 VITALS — BP 128/74 | HR 76 | Temp 97.8°F | Resp 14 | Ht 66.0 in | Wt 230.0 lb

## 2014-05-17 DIAGNOSIS — Z0000 Encounter for general adult medical examination without abnormal findings: Secondary | ICD-10-CM | POA: Diagnosis not present

## 2014-05-17 DIAGNOSIS — Z23 Encounter for immunization: Secondary | ICD-10-CM

## 2014-05-17 DIAGNOSIS — I10 Essential (primary) hypertension: Secondary | ICD-10-CM | POA: Diagnosis not present

## 2014-05-17 DIAGNOSIS — J302 Other seasonal allergic rhinitis: Secondary | ICD-10-CM

## 2014-05-17 DIAGNOSIS — E669 Obesity, unspecified: Secondary | ICD-10-CM

## 2014-05-17 DIAGNOSIS — H9202 Otalgia, left ear: Secondary | ICD-10-CM | POA: Diagnosis not present

## 2014-05-17 DIAGNOSIS — Z1321 Encounter for screening for nutritional disorder: Secondary | ICD-10-CM

## 2014-05-17 DIAGNOSIS — Z1239 Encounter for other screening for malignant neoplasm of breast: Secondary | ICD-10-CM | POA: Diagnosis not present

## 2014-05-17 MED ORDER — HYDROCHLOROTHIAZIDE 12.5 MG PO CAPS
12.5000 mg | ORAL_CAPSULE | Freq: Every day | ORAL | Status: DC
Start: 2014-05-17 — End: 2015-04-02

## 2014-05-17 MED ORDER — CETIRIZINE HCL 10 MG PO TABS: 10.0000 mg | ORAL_TABLET | Freq: Every day | ORAL | Status: DC

## 2014-05-17 NOTE — Assessment & Plan Note (Signed)
Return for fasting labs, family history of heart disease Healthy eating and weight loss needed

## 2014-05-17 NOTE — Assessment & Plan Note (Signed)
Her otalgia may be coming from some of her sinus pressure as well as some fluid. Have her use Sudafed for a few days. We will also start Zyrtec at bedtime. In general her hearing exam was okay. She has persistent symptoms despite this treatment then I will refer her to ear nose and throat.

## 2014-05-17 NOTE — Assessment & Plan Note (Signed)
Well controlled, no change to meds 

## 2014-05-17 NOTE — Patient Instructions (Addendum)
Take allergy medication as prescribed Use Sudafed for next 3-4 days to decongest, call if not better and you will be referred to ENT Set up Mammogram - call number provided Return for fasting labs  TDAP given F/U 4 months for blood pressure

## 2014-05-17 NOTE — Progress Notes (Signed)
Patient ID: Toni CreaseSusan J Beard, female   DOB: 1960/08/15, 54 y.o.   MRN: [id number]   Subjective:    Patient ID: Toni CreaseSusan J Bartley, female    DOB: 1960/08/15, 54 y.o.   MRN: [id number]  Patient presents for New Patient- Establish Care and Illness  Pt here to establish care/ CPE, she has not had a primary care provider in a few years. [facility]. History of hypertension she has been on hydrochlorothiazide 12.5 mg with good control. She has no history of any heart disease. No history of any lung disease. She is not diabetic no hyperlipidemia.  She does suffer with seasonal allergies. For the past couple weeks she has had a tickle in her throat as well as some sneezing and drainage she's also had some left ear discomfort more of a muffling of her ear instead of true pain. She has not had any discharge from the ear. No fever.   Her Pap smears up-to-date she has history of cervical cancer which was treated. She is due for mammogram she is also due for colonoscopy will once hold off on this.  Immunizations reviewed she is due for tetanus booster.  Has house cleaning business She is due for fasting labs Review Of Systems:  GEN- denies fatigue, fever, weight loss,weakness, recent illness HEENT- denies eye drainage, change in vision,+ nasal discharge, CVS- denies chest pain, palpitations RESP- denies SOB, cough, wheeze ABD- denies N/V, change in stools, abd pain GU- denies dysuria, hematuria, dribbling, incontinence MSK- denies joint pain, muscle aches, injury Neuro- denies headache, dizziness, syncope, seizure activity       Objective:    BP 128/74 mmHg  Pulse 76  Temp(Src) 97.8 F (36.6 C) (Oral)  Resp 14  Ht 5\' 6"  (1.676 m)  Wt 230 lb (104.327 kg)  BMI 37.14 kg/m2 GEN- NAD, alert and oriented x3 HEENT- PERRL, EOMI, non injected sclera, pink conjunctiva, MMM, oropharynx clear, TM clear bilat, nares clear rhinorrhea, no maxillary sinus tenderness Neck-  Supple, no thyromegaly, no LAD CVS- RRR, no murmur RESP-CTAB ABD-NABS,soft,NT,ND EXT- No edema Pulses- Radial, DP- 2+        Assessment & Plan:      Problem List Items Addressed This Visit      Unprioritized   Obesity   Relevant Orders   Lipid panel   Hypertension - Primary   Relevant Medications   hydrochlorothiazide (MICROZIDE) 12.5 MG capsule   Other Relevant Orders   CBC with Differential/Platelet   Comprehensive metabolic panel   Lipid panel   TSH    Other Visit Diagnoses    Routine general medical examination at a health care facility        CPE done, PAP UTD, Mammo to be scheduled, Colonoscopy to be scheduled at later date, TDAP given    Otalgia, left        Encounter for vitamin deficiency screening        Relevant Orders    Vitamin D, 25-hydroxy    Breast cancer screening        Relevant Orders    MM DIGITAL SCREENING BILATERAL    Need for prophylactic vaccination with combined diphtheria-tetanus-pertussis (DTP) vaccine        Relevant Orders    Tdap vaccine greater than or equal to 7yo IM (Completed)       Note: This dictation was prepared with Dragon dictation along with smaller phrase technology. Any transcriptional errors that result from this process are  unintentional.

## 2014-05-23 ENCOUNTER — Encounter: Payer: Self-pay | Admitting: *Deleted

## 2015-04-02 ENCOUNTER — Encounter: Payer: Self-pay | Admitting: Family Medicine

## 2015-04-02 ENCOUNTER — Ambulatory Visit (INDEPENDENT_AMBULATORY_CARE_PROVIDER_SITE_OTHER): Payer: BLUE CROSS/BLUE SHIELD | Admitting: Family Medicine

## 2015-04-02 ENCOUNTER — Ambulatory Visit (HOSPITAL_COMMUNITY)
Admission: RE | Admit: 2015-04-02 | Discharge: 2015-04-02 | Disposition: A | Discharge status: Home / Self Care | Payer: BLUE CROSS/BLUE SHIELD | Source: Ambulatory Visit | Attending: Family Medicine | Admitting: Family Medicine

## 2015-04-02 VITALS — BP 112/72 | HR 82 | Temp 98.0°F | Resp 18 | Ht 66.0 in | Wt 255.0 lb

## 2015-04-02 DIAGNOSIS — Z1321 Encounter for screening for nutritional disorder: Secondary | ICD-10-CM

## 2015-04-02 DIAGNOSIS — I10 Essential (primary) hypertension: Secondary | ICD-10-CM

## 2015-04-02 DIAGNOSIS — R011 Cardiac murmur, unspecified: Secondary | ICD-10-CM

## 2015-04-02 DIAGNOSIS — X58XXXA Exposure to other specified factors, initial encounter: Secondary | ICD-10-CM | POA: Diagnosis not present

## 2015-04-02 DIAGNOSIS — M7989 Other specified soft tissue disorders: Secondary | ICD-10-CM | POA: Diagnosis not present

## 2015-04-02 DIAGNOSIS — S91002A Unspecified open wound, left ankle, initial encounter: Secondary | ICD-10-CM

## 2015-04-02 DIAGNOSIS — H9313 Tinnitus, bilateral: Secondary | ICD-10-CM | POA: Diagnosis not present

## 2015-04-02 DIAGNOSIS — S99912A Unspecified injury of left ankle, initial encounter: Secondary | ICD-10-CM

## 2015-04-02 DIAGNOSIS — H9319 Tinnitus, unspecified ear: Secondary | ICD-10-CM | POA: Insufficient documentation

## 2015-04-02 DIAGNOSIS — I499 Cardiac arrhythmia, unspecified: Secondary | ICD-10-CM

## 2015-04-02 DIAGNOSIS — E669 Obesity, unspecified: Secondary | ICD-10-CM | POA: Diagnosis not present

## 2015-04-02 MED ORDER — HYDROCHLOROTHIAZIDE 12.5 MG PO CAPS: 12.5000 mg | ORAL_CAPSULE | Freq: Every day | ORAL | Status: DC

## 2015-04-02 MED ORDER — DOXYCYCLINE HYCLATE 100 MG PO TABS: 100.0000 mg | ORAL_TABLET | Freq: Two times a day (BID) | ORAL | Status: DC

## 2015-04-02 NOTE — Patient Instructions (Signed)
Take antibiotics as prescribed Elevate ankle Get xray 2 D Echo to be done of your heart for the murmur and palpitations We will call with blood work F/U pending results

## 2015-04-02 NOTE — Progress Notes (Signed)
Patient ID: Toni Beard, female   DOB: 1960/11/24, 55 y.o.   MRN: [id number]    Subjective:    Patient ID: Toni Beard, female    DOB: 02-May-1960, 55 y.o.   MRN: [id number]  Patient presents for Ringing in ears; Unhealing lesion on left ankle; and Feels like heart is skipping on several occasions  patient with multiple concerns. The most pertinent is that she's had an open wound on her left medial ankle for the past few months. She states it will scab  Over and over again. She remembers twisting her ankle swelling directly afterward since then the swelling has not gone down completely. She is able to walk and do her activities. She's had very minimal drainage from the wound she does use triple antibiotic with opens up. Is then scabbing over and then opening back on and off potassium months. She denies any fever. She does have varicose veins in this area but has not had any bleeding from the drains.   She is using intermittent episodes where she has some palpitations in her heart beats fast. She denies any dizziness or shortness of breath or chest pain. The symptoms are very short-lived.    she has history of tinnitus she typically gets a few times a years that she when she gets congested. She did go to an urgent care they advised allergy medication which helps some. When she pops her ears it goes away    Review Of Systems:  GEN- denies fatigue, fever, weight loss,weakness, recent illness HEENT- denies eye drainage, change in vision, nasal discharge, CVS- denies chest pain, +palpitations RESP- denies SOB, cough, wheeze ABD- denies N/V, change in stools, abd pain GU- denies dysuria, hematuria, dribbling, incontinence MSK- +joint pain, muscle aches, injury Neuro- denies headache, dizziness, syncope, seizure activity       Objective:    BP 112/72 mmHg  Pulse 82  Temp(Src) 98 F (36.7 C) (Oral)  Resp 18  Ht  (1.676 m)  Wt 255 lb (115.667 kg)  BMI 41.18 kg/m2 GEN- NAD,  alert and oriented x3 HEENT- PERRL, EOMI, non injected sclera, pink conjunctiva, MMM, oropharynx clear, TM clear no effusion, nares clear  Neck- Supple, no thyromegaly CVS- RRR, 2/6SEM  RESP-CTAB EXT- left pedal edema MSK- Left ankle good ROM, dime size opening with mild serosangiounes drainage, erythema surrounding, mild TTP , Varicose veins noted  Pulses- Radial, DP- 2+, DP 1+   EKG- NSR, poor R wave progression       Assessment & Plan:      Problem List Items Addressed This Visit    Tinnitus     Chronic problem improves with decongestant. Advised her she can take Sudafed for 2 or 3 days to stop this      Obesity   Hypertension - Primary    Controlled, non fasting labs obtained today       Relevant Medications   hydrochlorothiazide (MICROZIDE) 12.5 MG capsule   Other Relevant Orders   Echocardiogram    Other Visit Diagnoses    Irregular heart beats        ? palpitations, check TSH, labs, obtain 2D Echo, continue current BP meds, they do not occur very often, reduce caffiene    Relevant Orders    EKG 12-Lead (Completed)    Echocardiogram    Ankle injury, left, initial encounter        Relevant Orders    DG Ankle Complete Left    Encounter for vitamin deficiency  screening        Ankle wound, left, initial encounter        Non healing wound/almost ulceration like, after injury, obtain Xray, start doxy, may need wound care    Heart murmur        Relevant Orders    Echocardiogram       Note: This dictation was prepared with Dragon dictation along with smaller phrase technology. Any transcriptional errors that result from this process are unintentional.

## 2015-04-02 NOTE — Assessment & Plan Note (Signed)
Controlled, non fasting labs obtained today

## 2015-04-02 NOTE — Assessment & Plan Note (Signed)
Chronic problem improves with decongestant. Advised her she can take Sudafed for 2 or 3 days to stop this

## 2015-04-03 LAB — CBC WITH DIFFERENTIAL/PLATELET
BASOS PCT: 0 % (ref 0–1)
Basophils Absolute: 0 10*3/uL (ref 0.0–0.1)
EOS ABS: 0.3 10*3/uL (ref 0.0–0.7)
Eosinophils Relative: 3 % (ref 0–5)
HCT: 39.1 % (ref 36.0–46.0)
HEMOGLOBIN: 13.1 g/dL (ref 12.0–15.0)
Lymphocytes Relative: 30 % (ref 12–46)
Lymphs Abs: 2.7 10*3/uL (ref 0.7–4.0)
MCH: 30 pg (ref 26.0–34.0)
MCHC: 33.5 g/dL (ref 30.0–36.0)
MCV: 89.7 fL (ref 78.0–100.0)
MONOS PCT: 6 % (ref 3–12)
MPV: 9.7 fL (ref 8.6–12.4)
Monocytes Absolute: 0.5 10*3/uL (ref 0.1–1.0)
NEUTROS ABS: 5.6 10*3/uL (ref 1.7–7.7)
NEUTROS PCT: 61 % (ref 43–77)
Platelets: 274 10*3/uL (ref 150–400)
RBC: 4.36 MIL/uL (ref 3.87–5.11)
RDW: 13.4 % (ref 11.5–15.5)
WBC: 9.1 10*3/uL (ref 4.0–10.5)

## 2015-04-03 LAB — COMPREHENSIVE METABOLIC PANEL
ALK PHOS: 91 U/L (ref 33–130)
ALT: 12 U/L (ref 6–29)
AST: 14 U/L (ref 10–35)
Albumin: 3.7 g/dL (ref 3.6–5.1)
BILIRUBIN TOTAL: 0.5 mg/dL (ref 0.2–1.2)
BUN: 14 mg/dL (ref 7–25)
CO2: 28 mmol/L (ref 20–31)
CREATININE: 0.62 mg/dL (ref 0.50–1.05)
Calcium: 8.8 mg/dL (ref 8.6–10.4)
Chloride: 102 mmol/L (ref 98–110)
Glucose, Bld: 78 mg/dL (ref 70–99)
Potassium: 3.6 mmol/L (ref 3.5–5.3)
SODIUM: 141 mmol/L (ref 135–146)
TOTAL PROTEIN: 6.2 g/dL (ref 6.1–8.1)

## 2015-04-03 LAB — LIPID PANEL
CHOL/HDL RATIO: 3.2 ratio (ref <=5.0)
CHOLESTEROL: 175 mg/dL (ref 125–200)
HDL: 55 mg/dL (ref >=46)
LDL Cholesterol: 100 mg/dL (ref <130)
Triglycerides: 98 mg/dL (ref <150)
VLDL: 20 mg/dL (ref <30)

## 2015-04-03 LAB — TSH: TSH: 3.07 m[IU]/L

## 2015-04-03 LAB — VITAMIN D 25 HYDROXY (VIT D DEFICIENCY, FRACTURES): VIT D 25 HYDROXY: 34 ng/mL (ref 30–100)

## 2015-04-11 ENCOUNTER — Encounter: Payer: Self-pay | Admitting: Family Medicine

## 2015-04-11 ENCOUNTER — Ambulatory Visit (INDEPENDENT_AMBULATORY_CARE_PROVIDER_SITE_OTHER): Payer: BLUE CROSS/BLUE SHIELD | Admitting: Family Medicine

## 2015-04-11 VITALS — BP 118/70 | HR 82 | Temp 98.4°F | Resp 14 | Ht 66.0 in | Wt 259.0 lb

## 2015-04-11 DIAGNOSIS — I868 Varicose veins of other specified sites: Secondary | ICD-10-CM | POA: Diagnosis not present

## 2015-04-11 DIAGNOSIS — L03116 Cellulitis of left lower limb: Secondary | ICD-10-CM | POA: Diagnosis not present

## 2015-04-11 DIAGNOSIS — S91002D Unspecified open wound, left ankle, subsequent encounter: Secondary | ICD-10-CM | POA: Diagnosis not present

## 2015-04-11 DIAGNOSIS — I8390 Asymptomatic varicose veins of unspecified lower extremity: Secondary | ICD-10-CM

## 2015-04-11 NOTE — Progress Notes (Signed)
Patient ID: Toni Beard, female   DOB: 01/02/1961, 55 y.o.   MRN: [id number]    Subjective:    Patient ID: Toni Beard, female    DOB: [birth year], 55 y.o.   MRN: [id number]  Patient presents for F/U Ankle Wound . She's had a circular wound on left ankle for months after she twisted and x-ray did not show any evidence of bony involvement however she did extensive soft tissue inflammation. She also had mild drainage in the last visit. I started her on doxycycline she now had this Opening the redness has decreased she has some swelling that she frequently occurs with the end of the day. She does have underlying varicose vein inspiratory pains on the ankle and foot as well. She denies any fever. She's been taking over-the-counter medications for pain as needed.    Review Of Systems:  GEN- denies fatigue, fever, weight loss,weakness, recent illness CVS- denies chest pain, palpitations RESP- denies SOB, cough, wheeze MSK- denies joint pain, muscle aches, injury        Objective:    BP 118/70 mmHg  Pulse 82  Temp(Src) 98.4 F (36.9 C) (Oral)  Resp 14  Ht  (1.676 m)  Wt 259 lb (117.482 kg)  BMI 41.82 kg/m2 GEN- NAD, alert and oriented x3 - EXT- left pedal edema Skin- minimal erythema seen,  dime size opening scab at center, no necrosis seen  mild TTP , Varicose veins noted  Pulses- Radial, DP- 2+,  Left DP 1+        Assessment & Plan:      Problem List Items Addressed This Visit    None    Visit Diagnoses    Cellulitis of left ankle    -  Primary    Much improved, wound has scab as well. No pus seen, Concern due her venous issues with varicose veins this may be contributing to poor healing over time as well. Recheck with her in 1 week, if still not healed despite antibiotics, send her to vascular to be seen for insufficieny     Open wound of left ankle, subsequent encounter           Note: This dictation was prepared with Dragon dictation along with smaller  phrase technology. Any transcriptional errors that result from this process are unintentional.

## 2015-04-11 NOTE — Patient Instructions (Addendum)
Complete antibiotics  We will call in 1 week to check on this  F/U 6 months  for Physical

## 2015-04-25 ENCOUNTER — Telehealth: Payer: Self-pay | Admitting: *Deleted

## 2015-04-25 NOTE — Telephone Encounter (Signed)
Pt is scheduled for ECHOCARDIOGRAM on Thursday March 9 at 1:30pm, is pt aware of appt and [employer] authorizated with [id number] valid 04/04/15-05/03/15

## 2015-05-01 ENCOUNTER — Inpatient Hospital Stay (HOSPITAL_COMMUNITY): Admission: RE | Admit: 2015-05-01 | Payer: BLUE CROSS/BLUE SHIELD | Source: Ambulatory Visit

## 2015-05-01 ENCOUNTER — Ambulatory Visit (HOSPITAL_COMMUNITY): Payer: BLUE CROSS/BLUE SHIELD | Attending: Cardiovascular Disease

## 2015-05-01 ENCOUNTER — Other Ambulatory Visit: Payer: Self-pay

## 2015-05-01 DIAGNOSIS — Z6841 Body Mass Index (BMI) 40.0 and over, adult: Secondary | ICD-10-CM | POA: Diagnosis not present

## 2015-05-01 DIAGNOSIS — E669 Obesity, unspecified: Secondary | ICD-10-CM | POA: Insufficient documentation

## 2015-05-01 DIAGNOSIS — I499 Cardiac arrhythmia, unspecified: Secondary | ICD-10-CM | POA: Insufficient documentation

## 2015-05-01 DIAGNOSIS — R011 Cardiac murmur, unspecified: Secondary | ICD-10-CM | POA: Insufficient documentation

## 2015-05-01 DIAGNOSIS — I10 Essential (primary) hypertension: Secondary | ICD-10-CM

## 2015-05-01 DIAGNOSIS — I358 Other nonrheumatic aortic valve disorders: Secondary | ICD-10-CM | POA: Diagnosis not present

## 2015-05-01 DIAGNOSIS — I119 Hypertensive heart disease without heart failure: Secondary | ICD-10-CM | POA: Diagnosis not present

## 2015-12-08 ENCOUNTER — Ambulatory Visit (INDEPENDENT_AMBULATORY_CARE_PROVIDER_SITE_OTHER): Payer: BLUE CROSS/BLUE SHIELD | Admitting: Orthopedic Surgery

## 2015-12-08 DIAGNOSIS — I87332 Chronic venous hypertension (idiopathic) with ulcer and inflammation of left lower extremity: Secondary | ICD-10-CM | POA: Diagnosis not present

## 2016-01-05 ENCOUNTER — Ambulatory Visit (INDEPENDENT_AMBULATORY_CARE_PROVIDER_SITE_OTHER): Payer: BLUE CROSS/BLUE SHIELD | Admitting: Orthopedic Surgery

## 2016-01-05 ENCOUNTER — Encounter (INDEPENDENT_AMBULATORY_CARE_PROVIDER_SITE_OTHER): Payer: Self-pay | Admitting: Orthopedic Surgery

## 2016-01-05 VITALS — Ht 66.0 in | Wt 259.0 lb

## 2016-01-05 DIAGNOSIS — L97929 Non-pressure chronic ulcer of unspecified part of left lower leg with unspecified severity: Secondary | ICD-10-CM

## 2016-01-05 DIAGNOSIS — I87332 Chronic venous hypertension (idiopathic) with ulcer and inflammation of left lower extremity: Secondary | ICD-10-CM

## 2016-01-05 NOTE — Progress Notes (Signed)
Wound Care Note   Patient: Toni Beard           Date of Birth: 22-Oct-1960           MRN: [id number]             PCP: Frazier RichardsIXON,MARY BETH, PA-C Visit Date: 01/05/2016   Assessment & Plan: Visit Diagnoses: No diagnosis found.  Plan: Iodosorb dressing applied today. Suggested she apply hydrocortisone to dermatitis around the ulcer. Continue Vive compression stockings around the clock. Remove to bathe and shower. Follow up in the office in 4 weeks.  Follow-Up Instructions: No Follow-up on file.  Orders:  No orders of the defined types were placed in this encounter.  No orders of the defined types were placed in this encounter.     Procedures: No notes on file   Clinical Data: No additional findings.   No images are attached to the encounter.   Subjective: Chief Complaint  Patient presents with  . Left Ankle - Wound Check    Ulceration left medial malleolus ulceration    Patient presents today for 4 week follow up of left medial malleolus ulceration. She is wearing Vive compression stocking. She still has itching persistent. There is eschar, there is minimal drainage. She states scab does fall off and wound comes back. There is no odor, no swelling.    Wound Check     Review of Systems  Constitutional: Negative for chills and fever.  Skin: Positive for rash and wound.  All other systems reviewed and are negative.    Objective: Vital Signs: Ht 5\' 6"  (1.676 m)   Wt 259 lb (117.5 kg)   BMI 41.80 kg/m   Physical Exam: Patient is alert and oriented with no adenopathy.  Well dressed.  Normal affect. Respirations easy. Bilateral lower extremities with pitting edema and brawny skin color changes. Left medial malleolus with open ulceration that is no 7 mm in diameter. Does have pround granulation tissue. This was touched with silver nitrate. No depth. There is 100% beefy granulation tissue in the wound bed. Surrounding eschar debrided manually. No drainage. There is  surrounding dermatitis with abrasions from scratching. No surrounding maceration or cellulitis. No sign of infection.    Specialty Comments: No specialty comments available.   PMFS History: Patient Active Problem List   Diagnosis Date Noted  . Tinnitus 04/02/2015  . Obesity 05/17/2014  . Seasonal allergies   . Carcinoma in situ of cervix uteri 02/06/2013  . Hypertension 01/12/2013  . Contraceptive management 01/12/2013   Past Medical History:  Diagnosis Date  . Allergy    seasonal  . Contraceptive management 01/12/2013  . Hypertension   . Obesity     Family History  Problem Relation Age of Onset  . Diabetes Mother   . Hypertension Mother   . Heart disease Mother   . Arthritis Mother   . Hyperlipidemia Mother   . Hypertension Sister   . Cancer Sister     kidney,lung  . Depression Sister   . Miscarriages / Stillbirths Sister   . Hypertension Brother   . Cancer Brother     prostate  . Heart disease Brother   . Vision loss Maternal Grandfather   . Early death Father   . Hearing loss Brother   . Learning disabilities Brother    Past Surgical History:  Procedure Laterality Date  . CERVICAL CONIZATION W/BX N/A 03/14/2013   Procedure: CONIZATION CERVIX ;  Surgeon: Lazaro ArmsLuther H Eure, MD;  Location: [facility];  Service: Gynecology;  Laterality: N/A;  . HOLMIUM LASER APPLICATION N/A 03/14/2013   Procedure: HOLMIUM LASER APPLICATION;  Surgeon: Lazaro ArmsLuther H Eure, MD;  Location: [facility];  Service: Gynecology;  Laterality: N/A;   Social History   Occupational History  . Not on file.   Social History Main Topics  . Smoking status: Never Smoker  . Smokeless tobacco: Never Used  . Alcohol use 0.0 oz/week     Comment: occasionally   . Drug use: No  . Sexual activity: Yes    Birth control/ protection: Condom

## 2016-02-04 ENCOUNTER — Ambulatory Visit (INDEPENDENT_AMBULATORY_CARE_PROVIDER_SITE_OTHER): Payer: BLUE CROSS/BLUE SHIELD | Admitting: Family

## 2016-02-04 VITALS — Ht 66.0 in | Wt 259.0 lb

## 2016-02-04 DIAGNOSIS — I87332 Chronic venous hypertension (idiopathic) with ulcer and inflammation of left lower extremity: Secondary | ICD-10-CM

## 2016-02-04 DIAGNOSIS — L97929 Non-pressure chronic ulcer of unspecified part of left lower leg with unspecified severity: Secondary | ICD-10-CM

## 2016-02-04 NOTE — Progress Notes (Signed)
Office Visit Note   Patient: Toni Beard           Date of Birth: January 16, 1961           MRN: [id number] Visit Date: 02/04/2016              Requested by: Dorena BodoMary B Dixon, PA-C [address] [place], [place] [id number] PCP: Frazier RichardsIXON,MARY BETH, PA-C   Assessment & Plan: Visit Diagnoses:  1. Chronic venous hypertension (idiopathic) with ulcer and inflammation of left lower extremity (HCC)     Plan: Continue with Vive compression stocking daily. May continue with vaseline. Follow up in office in 4 more weeks. Anticipate wound being healed at that time.   Follow-Up Instructions: Return in about 4 weeks (around 03/03/2016).   Orders:  No orders of the defined types were placed in this encounter.  No orders of the defined types were placed in this encounter.     Procedures: No procedures performed   Clinical Data: No additional findings.   Subjective: Chief Complaint  Patient presents with  . Left Ankle - Wound Check    Lateral malleolus ankle ulcer    Patient is a 55 year old woman seen in follow up for a medial side ankle ulcer associated with venous stasis. She applies a vive compression sock with direct skin contact daily. There is no swelling but inflammation and purplish skin color to the surrounding scabbed over wound area. Pt voices no concerns.    Wound Check     Review of Systems  Constitutional: Negative for chills and fever.     Objective: Vital Signs: Ht 5\' 6"  (1.676 m)   Wt 259 lb (117.5 kg)   BMI 41.80 kg/m   Physical Exam  Constitutional: She is oriented to person, place, and time. She appears well-developed and well-nourished.  Pulmonary/Chest: Effort normal.  Musculoskeletal:       Left lower leg: She exhibits no swelling.  Trace edema. Eschar debrided from wound. Underlying ulcer is nearly fully epithelialized. No drainage. No erythema or warmth. Does have surrounding dermatitis and brawny skin color changes.  Neurological: She is alert  and oriented to person, place, and time.  Psychiatric: She has a normal mood and affect.  Nursing note reviewed.   Ortho Exam  Specialty Comments:  No specialty comments available.  Imaging: No results found.   PMFS History: Patient Active Problem List   Diagnosis Date Noted  . Chronic venous hypertension (idiopathic) with ulcer and inflammation of left lower extremity (HCC) 01/05/2016  . Tinnitus 04/02/2015  . Obesity 05/17/2014  . Seasonal allergies   . Carcinoma in situ of cervix uteri 02/06/2013  . Hypertension 01/12/2013  . Contraceptive management 01/12/2013   Past Medical History:  Diagnosis Date  . Allergy    seasonal  . Contraceptive management 01/12/2013  . Hypertension   . Obesity     Family History  Problem Relation Age of Onset  . Diabetes Mother   . Hypertension Mother   . Heart disease Mother   . Arthritis Mother   . Hyperlipidemia Mother   . Hypertension Sister   . Cancer Sister     kidney,lung  . Depression Sister   . Miscarriages / Stillbirths Sister   . Hypertension Brother   . Cancer Brother     prostate  . Heart disease Brother   . Vision loss Maternal Grandfather   . Early death Father   . Hearing loss Brother   . Learning  disabilities Brother     Past Surgical History:  Procedure Laterality Date  . CERVICAL CONIZATION W/BX N/A 03/14/2013   Procedure: CONIZATION CERVIX ;  Surgeon: Lazaro ArmsLuther H Eure, MD;  Location: [facility];  Service: Gynecology;  Laterality: N/A;  . HOLMIUM LASER APPLICATION N/A 03/14/2013   Procedure: HOLMIUM LASER APPLICATION;  Surgeon: Lazaro ArmsLuther H Eure, MD;  Location: [facility];  Service: Gynecology;  Laterality: N/A;   Social History   Occupational History  . Not on file.   Social History Main Topics  . Smoking status: Never Smoker  . Smokeless tobacco: Never Used  . Alcohol use 0.0 oz/week     Comment: occasionally   . Drug use: No  . Sexual activity: Yes    Birth control/ protection: Condom

## 2016-03-03 ENCOUNTER — Ambulatory Visit (INDEPENDENT_AMBULATORY_CARE_PROVIDER_SITE_OTHER): Payer: BLUE CROSS/BLUE SHIELD | Admitting: Family

## 2016-03-09 ENCOUNTER — Encounter (INDEPENDENT_AMBULATORY_CARE_PROVIDER_SITE_OTHER): Payer: Self-pay | Admitting: Family

## 2016-03-09 ENCOUNTER — Ambulatory Visit (INDEPENDENT_AMBULATORY_CARE_PROVIDER_SITE_OTHER): Payer: BLUE CROSS/BLUE SHIELD | Admitting: Family

## 2016-03-09 VITALS — Ht 66.0 in | Wt 259.0 lb

## 2016-03-09 DIAGNOSIS — L97929 Non-pressure chronic ulcer of unspecified part of left lower leg with unspecified severity: Secondary | ICD-10-CM

## 2016-03-09 DIAGNOSIS — I87332 Chronic venous hypertension (idiopathic) with ulcer and inflammation of left lower extremity: Secondary | ICD-10-CM | POA: Diagnosis not present
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
## 2016-04-23 ENCOUNTER — Ambulatory Visit (INDEPENDENT_AMBULATORY_CARE_PROVIDER_SITE_OTHER): Payer: BLUE CROSS/BLUE SHIELD | Admitting: Family

## 2016-04-28 ENCOUNTER — Ambulatory Visit (INDEPENDENT_AMBULATORY_CARE_PROVIDER_SITE_OTHER): Payer: BLUE CROSS/BLUE SHIELD | Admitting: Family

## 2016-04-28 ENCOUNTER — Encounter (INDEPENDENT_AMBULATORY_CARE_PROVIDER_SITE_OTHER): Payer: Self-pay | Admitting: Family

## 2016-04-28 VITALS — Ht 66.0 in | Wt 259.0 lb

## 2016-04-28 DIAGNOSIS — L97929 Non-pressure chronic ulcer of unspecified part of left lower leg with unspecified severity: Secondary | ICD-10-CM

## 2016-04-28 DIAGNOSIS — I87332 Chronic venous hypertension (idiopathic) with ulcer and inflammation of left lower extremity: Secondary | ICD-10-CM | POA: Diagnosis not present

## 2016-04-28 MED ORDER — CEPHALEXIN 500 MG PO CAPS: 500.0000 mg | ORAL_CAPSULE | Freq: Two times a day (BID) | ORAL | 0 refills | Status: DC

## 2016-04-28 NOTE — Progress Notes (Signed)
Office Visit Note   Patient: Toni Beard           Date of Birth: Feb 20, 1961           MRN: [id number] Visit Date: 04/28/2016              Requested by: Dorena BodoMary B Dixon, PA-C [address] [place], [place] [address] PCP: Frazier RichardsIXON,MARY BETH, PA-C  Chief Complaint  Patient presents with  . Left Ankle - Wound Check    HPI: Patient is a 56 year old woman seen in follow up for left ankle medial ankle ulcer. states that she does wear her vive compression but she does not have them on today. Patient does state that she applies cortisone cream and vaseline around the ulcer daily. There is redness around the ulcer. It is scabbed.  Stockings are worn out with holes in soles.       Office Visit Note   Patient: Toni Beard           Date of Birth: Feb 20, 1961           MRN: [id number] Visit Date: 04/28/2016              Requested by: Dorena BodoMary B Dixon, PA-C [address] [place] [place], [place] [address] PCP: Frazier RichardsIXON,MARY BETH, PA-C  Chief Complaint  Patient presents with  . Left Ankle - Wound Check      Assessment & Plan: Visit Diagnoses:  1. Chronic venous hypertension (idiopathic) with ulcer and inflammation of left lower extremity (HCC)     Plan: She will continue with her compression stockings daily. Recommended new Vive compression stockings. May continue using Vaseline or Silvadene to soften the eschar. Continue with current treatment follow-up in the office in 8 weeks or sooner should any concerns arise sooner.  Follow-Up Instructions: Return in about 2 months (around 06/28/2016).   Ortho Exam Patient is alert and oriented. No adenopathy. Well-dressed. Normal affect. Respirations easy. Steady gait. Trace edema to the bilateral lower extremities. Left medial ankle with a ulceration that is 10 mm in diameter this is just proximal to her malleolus. Covered with eschar. There is surrounding dermatitis and abrasions from scratching. There is no drainage today no odor no cellulitis  no sign of infection.   Imaging: No results found.  Orders:  No orders of the defined types were placed in this encounter.  Meds ordered this encounter  Medications  . cephALEXin (KEFLEX) 500 MG capsule    Sig: Take 1 capsule (500 mg total) by mouth 2 (two) times daily.    Dispense:  28 capsule    Refill:  0     Procedures: No procedures performed  Clinical Data: No additional findings.  Subjective: Review of Systems  Constitutional: Negative for chills and fever.  Cardiovascular: Negative for leg swelling.  Skin: Positive for rash and wound.    Objective: Vital Signs: Ht 5\' 6"  (1.676 m)   Wt 259 lb (117.5 kg)   BMI 41.80 kg/m   Specialty Comments:  No specialty comments available.  PMFS History: Patient Active Problem List   Diagnosis Date Noted  . Chronic venous hypertension (idiopathic) with ulcer and inflammation of left lower extremity (HCC) 01/05/2016  . Tinnitus 04/02/2015  . Obesity 05/17/2014  . Seasonal allergies   . Carcinoma in situ of cervix uteri 02/06/2013  . Hypertension 01/12/2013  . Contraceptive management 01/12/2013   Past Medical History:  Diagnosis Date  . Allergy  seasonal  . Contraceptive management 01/12/2013  . Hypertension   . Obesity     Family History  Problem Relation Age of Onset  . Diabetes Mother   . Hypertension Mother   . Heart disease Mother   . Arthritis Mother   . Hyperlipidemia Mother   . Hypertension Sister   . Cancer Sister     kidney,lung  . Depression Sister   . Miscarriages / Stillbirths Sister   . Hypertension Brother   . Cancer Brother     prostate  . Heart disease Brother   . Vision loss Maternal Grandfather   . Early death Father   . Hearing loss Brother   . Learning disabilities Brother     Past Surgical History:  Procedure Laterality Date  . CERVICAL CONIZATION W/BX N/A 03/14/2013   Procedure: CONIZATION CERVIX ;  Surgeon: Lazaro Arms, MD;  Location: [facility];  Service: Gynecology;   Laterality: N/A;  . HOLMIUM LASER APPLICATION N/A 03/14/2013   Procedure: HOLMIUM LASER APPLICATION;  Surgeon: Lazaro Arms, MD;  Location: [facility];  Service: Gynecology;  Laterality: N/A;   Social History   Occupational History  . Not on file.   Social History Main Topics  . Smoking status: Never Smoker  . Smokeless tobacco: Never Used  . Alcohol use 0.0 oz/week     Comment: occasionally   . Drug use: No  . Sexual activity: Yes    Birth control/ protection: Condom     Ortho Exam  Imaging: No results found.  Labs: No results found for: HGBA1C, ESRSEDRATE, CRP, LABURIC, REPTSTATUS, GRAMSTAIN, CULT, LABORGA  Orders:  No orders of the defined types were placed in this encounter.  No orders of the defined types were placed in this encounter.    Procedures: No procedures performed  Clinical Data: No additional findings.  Subjective: Review of Systems  Objective: Vital Signs: Ht 5\' 6"  (1.676 m)   Wt 259 lb (117.5 kg)   BMI 41.80 kg/m   Specialty Comments:  No specialty comments available.  PMFS History: Patient Active Problem List   Diagnosis Date Noted  . Chronic venous hypertension (idiopathic) with ulcer and inflammation of left lower extremity (HCC) 01/05/2016  . Tinnitus 04/02/2015  . Obesity 05/17/2014  . Seasonal allergies   . Carcinoma in situ of cervix uteri 02/06/2013  . Hypertension 01/12/2013  . Contraceptive management 01/12/2013   Past Medical History:  Diagnosis Date  . Allergy    seasonal  . Contraceptive management 01/12/2013  . Hypertension   . Obesity     Family History  Problem Relation Age of Onset  . Diabetes Mother   . Hypertension Mother   . Heart disease Mother   . Arthritis Mother   . Hyperlipidemia Mother   . Hypertension Sister   . Cancer Sister     kidney,lung  . Depression Sister   . Miscarriages / Stillbirths Sister   . Hypertension Brother   . Cancer Brother     prostate  . Heart disease Brother   . Vision  loss Maternal Grandfather   . Early death Father   . Hearing loss Brother   . Learning disabilities Brother     Past Surgical History:  Procedure Laterality Date  . CERVICAL CONIZATION W/BX N/A 03/14/2013   Procedure: CONIZATION CERVIX ;  Surgeon: Lazaro Arms, MD;  Location: [facility];  Service: Gynecology;  Laterality: N/A;  . HOLMIUM LASER APPLICATION N/A 03/14/2013   Procedure: HOLMIUM LASER APPLICATION;  Surgeon: Lazaro Arms, MD;  Location: [facility];  Service: Gynecology;  Laterality: N/A;   Social History   Occupational History  . Not on file.   Social History Main Topics  . Smoking status: Never Smoker  . Smokeless tobacco: Never Used  . Alcohol use 0.0 oz/week     Comment: occasionally   . Drug use: No  . Sexual activity: Yes    Birth control/ protection: Condom

## 2016-05-29 ENCOUNTER — Other Ambulatory Visit: Payer: Self-pay | Admitting: Family Medicine

## 2016-05-31 ENCOUNTER — Other Ambulatory Visit: Payer: Self-pay | Admitting: Family Medicine

## 2016-05-31 NOTE — Telephone Encounter (Signed)
Requires office visit before any further refills can be given.  

## 2016-06-28 ENCOUNTER — Encounter (INDEPENDENT_AMBULATORY_CARE_PROVIDER_SITE_OTHER): Payer: Self-pay | Admitting: Family

## 2016-06-28 ENCOUNTER — Telehealth: Payer: Self-pay | Admitting: *Deleted

## 2016-06-28 ENCOUNTER — Ambulatory Visit (INDEPENDENT_AMBULATORY_CARE_PROVIDER_SITE_OTHER): Payer: BLUE CROSS/BLUE SHIELD | Admitting: Orthopedic Surgery

## 2016-06-28 ENCOUNTER — Ambulatory Visit (INDEPENDENT_AMBULATORY_CARE_PROVIDER_SITE_OTHER): Payer: BLUE CROSS/BLUE SHIELD | Admitting: Family Medicine

## 2016-06-28 ENCOUNTER — Ambulatory Visit (INDEPENDENT_AMBULATORY_CARE_PROVIDER_SITE_OTHER): Payer: BLUE CROSS/BLUE SHIELD | Admitting: Family

## 2016-06-28 ENCOUNTER — Encounter: Payer: Self-pay | Admitting: Family Medicine

## 2016-06-28 VITALS — BP 122/68 | HR 90 | Temp 97.7°F | Resp 14 | Ht 66.0 in | Wt 260.0 lb

## 2016-06-28 VITALS — Ht 66.0 in | Wt 260.0 lb

## 2016-06-28 DIAGNOSIS — I10 Essential (primary) hypertension: Secondary | ICD-10-CM

## 2016-06-28 DIAGNOSIS — I8393 Asymptomatic varicose veins of bilateral lower extremities: Secondary | ICD-10-CM | POA: Diagnosis not present

## 2016-06-28 DIAGNOSIS — Z6841 Body Mass Index (BMI) 40.0 and over, adult: Secondary | ICD-10-CM | POA: Diagnosis not present

## 2016-06-28 DIAGNOSIS — Z1211 Encounter for screening for malignant neoplasm of colon: Secondary | ICD-10-CM

## 2016-06-28 DIAGNOSIS — I87332 Chronic venous hypertension (idiopathic) with ulcer and inflammation of left lower extremity: Secondary | ICD-10-CM

## 2016-06-28 DIAGNOSIS — Z1239 Encounter for other screening for malignant neoplasm of breast: Secondary | ICD-10-CM

## 2016-06-28 DIAGNOSIS — Z1231 Encounter for screening mammogram for malignant neoplasm of breast: Secondary | ICD-10-CM

## 2016-06-28 DIAGNOSIS — L97929 Non-pressure chronic ulcer of unspecified part of left lower leg with unspecified severity: Secondary | ICD-10-CM

## 2016-06-28 MED ORDER — HYDROCHLOROTHIAZIDE 12.5 MG PO CAPS: ORAL_CAPSULE | ORAL | 2 refills | Status: DC

## 2016-06-28 NOTE — Patient Instructions (Addendum)
Schedule your mammogram  Schedule with your GYN  Cologuard testing  F/U 6 months

## 2016-06-28 NOTE — Telephone Encounter (Signed)
Order placed for Cologuard.

## 2016-06-28 NOTE — Progress Notes (Signed)
   Subjective:    Patient ID: Toni CreaseSusan J Bellissimo, female    DOB: 05/11/1960, 56 y.o.   MRN: [id number]  Patient presents for Meidcation Review/ Refill (is not fasting)  Pt here to f/u medications, last seen in Feb 2017 at that time had acute left ankle wound/ulcer with stasis changes. She is still following with orthopedics for the  Venous stasis ulcer, she has been wearing a special copper/sliver wool sock for healing. She did describe what sounds like spontaneous venous bleed this weekend that resolved without incident  HTN- currently on HCTZ , normal lipid and renal function in Feb 2017, no labs since then  Due for colonoscopy Due for Mammogram Due for PAP SMEAR - she has GYN    Review Of Systems:  GEN- denies fatigue, fever, weight loss,weakness, recent illness HEENT- denies eye drainage, change in vision, nasal discharge, CVS- denies chest pain, palpitations RESP- denies SOB, cough, wheeze ABD- denies N/V, change in stools, abd pain GU- denies dysuria, hematuria, dribbling, incontinence MSK- denies joint pain, muscle aches, injury Neuro- denies headache, dizziness, syncope, seizure activity       Objective:    BP 122/68   Pulse 90   Temp 97.7 F (36.5 C) (Oral)   Resp 14   Ht 5\' 6"  (1.676 m)   Wt 260 lb (117.9 kg)   SpO2 98%   BMI 41.97 kg/m  GEN- NAD, alert and oriented x3 HEENT- PERRL, EOMI, non injected sclera, pink conjunctiva, MMM, oropharynx clear Neck- Supple, no thyromegaly CVS- RRR, no murmur RESP-CTAB ABD-NABS,soft,NT,ND EXT- No edema Pulses- Radial, DP- 2+        Assessment & Plan:      Problem List Items Addressed This Visit    Varicose veins of both lower extremities    Continue compression hose Discussed dietary changes, weight loss would be beneficial       Obesity   Hypertension - Primary    Check CMET, CBC today, controlled with  HCTZ Schedule with GYN Go ahead and send for mammogram, also she prefers cologuard testing      Relevant Orders   CBC with Differential/Platelet   Comprehensive metabolic panel    Other Visit Diagnoses    Colon cancer screening       Breast cancer screening          Note: This dictation was prepared with Dragon dictation along with smaller phrase technology. Any transcriptional errors that result from this process are unintentional.

## 2016-06-28 NOTE — Progress Notes (Signed)
Office Visit Note   Patient: Toni CreaseSusan J Beard           Date of Birth: 1960-12-20           MRN: [id number] Visit Date: 06/28/2016              Requested by: Dorena Bodoixon, Mary B, PA-C [address] [place] [place], [place] [address] PCP: Dorena Bodoixon, Mary B, PA-C  Chief Complaint  Patient presents with  . Left Ankle - Pain    HPI: Patient is a 56 year old woman seen in follow up for left ankle medial ankle ulcer. Wears vive compression stocking daily to LLE. Apples vaseline around the ulcer daily. Feels she has made good progress.      Office Visit Note   Patient: Toni Beard           Date of Birth: 1960-12-20           MRN: [id number] Visit Date: 06/28/2016              Requested by: Dorena Bodoixon, Mary B, PA-C [address] [place] [place], [place] [address] PCP: Dorena Bodoixon, Mary B, PA-C  Chief Complaint  Patient presents with  . Left Ankle - Pain      Assessment & Plan: Visit Diagnoses:  1. Chronic venous hypertension (idiopathic) with ulcer and inflammation of left lower extremity (HCC)     Plan: She will continue with her compression stockings daily. Continue with current treatment follow-up in the office in 8 weeks or sooner should any concerns arise sooner.  Follow-Up Instructions: Return in about 2 months (around 08/28/2016).   Ortho Exam Patient is alert and oriented. No adenopathy. Well-dressed. Normal affect. Respirations easy. Steady gait. Trace edema to the bilateral lower extremities. Brawny skin color changes to LLE. Left medial ankle with a ulceration that is 5 mm in diameter this is just proximal to her malleolus. No depth. Good new epithelialization with reductionin wound bed. There is surrounding dermatitis and abrasions from scratching. There is no drainage today no odor no cellulitis no sign of infection.   Imaging: No results found.  Orders:  No orders of the defined types were placed in this encounter.  No orders of the defined types were placed in this  encounter.    Procedures: No procedures performed  Clinical Data: No additional findings.  Subjective: Review of Systems  Constitutional: Negative for chills and fever.  Cardiovascular: Negative for leg swelling.  Skin: Positive for rash and wound.    Objective: Vital Signs: Ht 5\' 6"  (1.676 m)   Wt 260 lb (117.9 kg)   BMI 41.97 kg/m   Specialty Comments:  No specialty comments available.  PMFS History: Patient Active Problem List   Diagnosis Date Noted  . Varicose veins of both lower extremities 06/28/2016  . Chronic venous hypertension (idiopathic) with ulcer and inflammation of left lower extremity (HCC) 01/05/2016  . Tinnitus 04/02/2015  . Obesity 05/17/2014  . Seasonal allergies   . Carcinoma in situ of cervix uteri 02/06/2013  . Hypertension 01/12/2013  . Contraceptive management 01/12/2013   Past Medical History:  Diagnosis Date  . Allergy    seasonal  . Contraceptive management 01/12/2013  . Hypertension   . Obesity     Family History  Problem Relation Age of Onset  . Diabetes Mother   . Hypertension Mother   . Heart disease Mother   . Arthritis Mother   . Hyperlipidemia Mother   . Hypertension Sister   .  Cancer Sister     kidney,lung  . Depression Sister   . Miscarriages / Stillbirths Sister   . Hypertension Brother   . Cancer Brother     prostate  . Heart disease Brother   . Vision loss Maternal Grandfather   . Early death Father   . Hearing loss Brother   . Learning disabilities Brother     Past Surgical History:  Procedure Laterality Date  . CERVICAL CONIZATION W/BX N/A 03/14/2013   Procedure: CONIZATION CERVIX ;  Surgeon: Lazaro Arms, MD;  Location: [facility];  Service: Gynecology;  Laterality: N/A;  . HOLMIUM LASER APPLICATION N/A 03/14/2013   Procedure: HOLMIUM LASER APPLICATION;  Surgeon: Lazaro Arms, MD;  Location: [facility];  Service: Gynecology;  Laterality: N/A;   Social History   Occupational History  . Not on file.    Social History Main Topics  . Smoking status: Never Smoker  . Smokeless tobacco: Never Used  . Alcohol use 0.0 oz/week     Comment: occasionally   . Drug use: No  . Sexual activity: Yes    Birth control/ protection: Condom     Ortho Exam  Imaging: No results found.  Labs: No results found for: HGBA1C, ESRSEDRATE, CRP, LABURIC, REPTSTATUS, GRAMSTAIN, CULT, LABORGA  Orders:  No orders of the defined types were placed in this encounter.  No orders of the defined types were placed in this encounter.    Procedures: No procedures performed  Clinical Data: No additional findings.  Subjective: Review of Systems  Objective: Vital Signs: Ht 5\' 6"  (1.676 m)   Wt 260 lb (117.9 kg)   BMI 41.97 kg/m   Specialty Comments:  No specialty comments available.  PMFS History: Patient Active Problem List   Diagnosis Date Noted  . Varicose veins of both lower extremities 06/28/2016  . Chronic venous hypertension (idiopathic) with ulcer and inflammation of left lower extremity (HCC) 01/05/2016  . Tinnitus 04/02/2015  . Obesity 05/17/2014  . Seasonal allergies   . Carcinoma in situ of cervix uteri 02/06/2013  . Hypertension 01/12/2013  . Contraceptive management 01/12/2013   Past Medical History:  Diagnosis Date  . Allergy    seasonal  . Contraceptive management 01/12/2013  . Hypertension   . Obesity     Family History  Problem Relation Age of Onset  . Diabetes Mother   . Hypertension Mother   . Heart disease Mother   . Arthritis Mother   . Hyperlipidemia Mother   . Hypertension Sister   . Cancer Sister     kidney,lung  . Depression Sister   . Miscarriages / Stillbirths Sister   . Hypertension Brother   . Cancer Brother     prostate  . Heart disease Brother   . Vision loss Maternal Grandfather   . Early death Father   . Hearing loss Brother   . Learning disabilities Brother     Past Surgical History:  Procedure Laterality Date  . CERVICAL CONIZATION  W/BX N/A 03/14/2013   Procedure: CONIZATION CERVIX ;  Surgeon: Lazaro Arms, MD;  Location: [facility];  Service: Gynecology;  Laterality: N/A;  . HOLMIUM LASER APPLICATION N/A 03/14/2013   Procedure: HOLMIUM LASER APPLICATION;  Surgeon: Lazaro Arms, MD;  Location: [facility];  Service: Gynecology;  Laterality: N/A;   Social History   Occupational History  . Not on file.   Social History Main Topics  . Smoking status: Never Smoker  . Smokeless tobacco: Never Used  . Alcohol  use 0.0 oz/week     Comment: occasionally   . Drug use: No  . Sexual activity: Yes    Birth control/ protection: Condom

## 2016-06-28 NOTE — Assessment & Plan Note (Signed)
Check CMET, CBC today, controlled with  HCTZ Schedule with GYN Go ahead and send for mammogram, also she prefers cologuard testing

## 2016-06-28 NOTE — Assessment & Plan Note (Signed)
Continue compression hose Discussed dietary changes, weight loss would be beneficial

## 2016-06-29 LAB — CBC WITH DIFFERENTIAL/PLATELET
Basophils Absolute: 0 cells/uL (ref 0–200)
Basophils Relative: 0 %
EOS PCT: 3 %
Eosinophils Absolute: 309 cells/uL (ref 15–500)
HEMATOCRIT: 40.1 % (ref 35.0–45.0)
Hemoglobin: 13.2 g/dL (ref 12.0–15.0)
LYMPHS ABS: 3399 {cells}/uL (ref 850–3900)
Lymphocytes Relative: 33 %
MCH: 30.1 pg (ref 27.0–33.0)
MCHC: 32.9 g/dL (ref 32.0–36.0)
MCV: 91.3 fL (ref 80.0–100.0)
MPV: 9.5 fL (ref 7.5–12.5)
Monocytes Absolute: 618 cells/uL (ref 200–950)
Monocytes Relative: 6 %
Neutro Abs: 5974 cells/uL (ref 1500–7800)
Neutrophils Relative %: 58 %
Platelets: 299 10*3/uL (ref 140–400)
RBC: 4.39 MIL/uL (ref 3.80–5.10)
RDW: 13.9 % (ref 11.0–15.0)
WBC: 10.3 10*3/uL (ref 3.8–10.8)

## 2016-06-29 LAB — COMPREHENSIVE METABOLIC PANEL
ALT: 10 U/L (ref 6–29)
AST: 16 U/L (ref 10–35)
Albumin: 3.7 g/dL (ref 3.6–5.1)
Alkaline Phosphatase: 86 U/L (ref 33–130)
BUN: 11 mg/dL (ref 7–25)
CO2: 31 mmol/L (ref 20–31)
CREATININE: 0.68 mg/dL (ref 0.50–1.05)
Calcium: 9.3 mg/dL (ref 8.6–10.4)
Chloride: 103 mmol/L (ref 98–110)
GLUCOSE: 77 mg/dL (ref 70–99)
Potassium: 3.6 mmol/L (ref 3.5–5.3)
SODIUM: 142 mmol/L (ref 135–146)
Total Bilirubin: 0.4 mg/dL (ref 0.2–1.2)
Total Protein: 6.3 g/dL (ref 6.1–8.1)

## 2016-07-08 ENCOUNTER — Ambulatory Visit (HOSPITAL_COMMUNITY): Payer: BLUE CROSS/BLUE SHIELD

## 2016-07-12 ENCOUNTER — Ambulatory Visit (HOSPITAL_COMMUNITY)
Admission: RE | Admit: 2016-07-12 | Discharge: 2016-07-12 | Disposition: A | Discharge status: Home / Self Care | Payer: BLUE CROSS/BLUE SHIELD | Source: Ambulatory Visit | Attending: Family Medicine | Admitting: Family Medicine

## 2016-07-12 ENCOUNTER — Encounter (HOSPITAL_COMMUNITY): Payer: Self-pay | Admitting: Radiology

## 2016-07-12 DIAGNOSIS — Z1231 Encounter for screening mammogram for malignant neoplasm of breast: Secondary | ICD-10-CM | POA: Insufficient documentation

## 2016-07-12 DIAGNOSIS — Z1239 Encounter for other screening for malignant neoplasm of breast: Secondary | ICD-10-CM

## 2016-08-30 ENCOUNTER — Ambulatory Visit (INDEPENDENT_AMBULATORY_CARE_PROVIDER_SITE_OTHER): Payer: BLUE CROSS/BLUE SHIELD | Admitting: Orthopedic Surgery

## 2017-03-22 ENCOUNTER — Other Ambulatory Visit: Payer: Self-pay | Admitting: Family Medicine

## 2017-03-30 ENCOUNTER — Ambulatory Visit: Payer: BLUE CROSS/BLUE SHIELD | Admitting: Family Medicine

## 2017-03-30 ENCOUNTER — Other Ambulatory Visit: Payer: Self-pay

## 2017-03-30 ENCOUNTER — Encounter: Payer: Self-pay | Admitting: Family Medicine

## 2017-03-30 VITALS — BP 128/74 | HR 76 | Temp 98.1°F | Resp 14 | Ht 66.0 in | Wt 257.0 lb

## 2017-03-30 DIAGNOSIS — I10 Essential (primary) hypertension: Secondary | ICD-10-CM | POA: Diagnosis not present

## 2017-03-30 DIAGNOSIS — Z6841 Body Mass Index (BMI) 40.0 and over, adult: Secondary | ICD-10-CM

## 2017-03-30 MED ORDER — PHENTERMINE HCL 37.5 MG PO TABS: 37.5000 mg | ORAL_TABLET | Freq: Every day | ORAL | 1 refills | Status: DC

## 2017-03-30 MED ORDER — HYDROCHLOROTHIAZIDE 12.5 MG PO CAPS: 12.5000 mg | ORAL_CAPSULE | Freq: Every day | ORAL | 2 refills | Status: DC

## 2017-03-30 NOTE — Assessment & Plan Note (Signed)
Discussed phentermine and the side effects of the medication.  She is to start with a half a tablet and increase to 1 full tablet.  We discussed meal planning with fresh fruits and veggies lean meats.  Increase her water and eating healthy carbs.  Her goal is 10 pounds of weight loss by her next visit in 2 months

## 2017-03-30 NOTE — Patient Instructions (Signed)
F/U 2 months for weight  

## 2017-03-30 NOTE — Assessment & Plan Note (Signed)
BP controlled he is nonfasting today she is going to follow-up in 2 months for her weight we would do fasting labs at that time.

## 2017-03-30 NOTE — Progress Notes (Signed)
   Subjective:    Patient ID: Toni CreaseSusan J Travelstead, female    DOB: 03/14/1960, 57 y.o.   MRN: [id number]  Patient presents for Medication Review/ Refill (is not fasting)   Pt here to f/u medications HTN- taking HCTZ as prescribed, no side effects with medication   Due for lipid panel, metabolic panel Doing well no concerns  Continue to wear the copper sock   Obeisty - interested in weight loss medications.  She  knows that she overeats. She has a big appetite, she has cut back on caffiene, drinking more water during the day. Walks some for exercise Does eat out a lot.     Review Of Systems:  GEN- denies fatigue, fever, weight loss,weakness, recent illness HEENT- denies eye drainage, change in vision, nasal discharge, CVS- denies chest pain, palpitations RESP- denies SOB, cough, wheeze ABD- denies N/V, change in stools, abd pain GU- denies dysuria, hematuria, dribbling, incontinence MSK- denies joint pain, muscle aches, injury Neuro- denies headache, dizziness, syncope, seizure activity       Objective:    BP 128/74   Pulse 76   Temp 98.1 F (36.7 C) (Oral)   Resp 14   Ht 5\' 6"  (1.676 m)   Wt 257 lb (116.6 kg)   SpO2 99%   BMI 41.48 kg/m  GEN- NAD, alert and oriented x3 HEENT- PERRL, EOMI, non injected sclera, pink conjunctiva, MMM, oropharynx clear Neck- Supple, no thyromegaly CVS- RRR, no murmur RESP-CTAB ABD-NABS,soft,NT,ND EXT- No edema Pulses- Radial, DP- 2+        Assessment & Plan:      Problem List Items Addressed This Visit      Unprioritized   Obesity    Discussed phentermine and the side effects of the medication.  She is to start with a half a tablet and increase to 1 full tablet.  We discussed meal planning with fresh fruits and veggies lean meats.  Increase her water and eating healthy carbs.  Her goal is 10 pounds of weight loss by her next visit in 2 months      Relevant Medications   phentermine (ADIPEX-P) 37.5 MG tablet   Hypertension -  Primary    BP controlled he is nonfasting today she is going to follow-up in 2 months for her weight we would do fasting labs at that time.      Relevant Medications   hydrochlorothiazide (MICROZIDE) 12.5 MG capsule      Note: This dictation was prepared with Dragon dictation along with smaller phrase technology. Any transcriptional errors that result from this process are unintentional.

## 2017-05-30 ENCOUNTER — Ambulatory Visit: Payer: BLUE CROSS/BLUE SHIELD | Admitting: Family Medicine

## 2017-06-07 ENCOUNTER — Other Ambulatory Visit: Payer: Self-pay

## 2017-06-07 ENCOUNTER — Emergency Department (HOSPITAL_COMMUNITY)
Admission: EM | Admit: 2017-06-07 | Discharge: 2017-06-07 | Disposition: A | Discharge status: Home / Self Care | Payer: BLUE CROSS/BLUE SHIELD | Attending: Emergency Medicine | Admitting: Emergency Medicine

## 2017-06-07 ENCOUNTER — Encounter (HOSPITAL_COMMUNITY): Payer: Self-pay

## 2017-06-07 DIAGNOSIS — I10 Essential (primary) hypertension: Secondary | ICD-10-CM | POA: Diagnosis not present

## 2017-06-07 DIAGNOSIS — M7918 Myalgia, other site: Secondary | ICD-10-CM

## 2017-06-07 DIAGNOSIS — M25511 Pain in right shoulder: Secondary | ICD-10-CM | POA: Diagnosis present

## 2017-06-07 DIAGNOSIS — M791 Myalgia, unspecified site: Secondary | ICD-10-CM | POA: Diagnosis not present

## 2017-06-07 MED ORDER — IBUPROFEN 600 MG PO TABS: 600.0000 mg | ORAL_TABLET | Freq: Four times a day (QID) | ORAL | 0 refills | Status: DC | PRN

## 2017-06-07 MED ORDER — METHOCARBAMOL 500 MG PO TABS: 500.0000 mg | ORAL_TABLET | Freq: Four times a day (QID) | ORAL | 0 refills | Status: AC | PRN
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
## 2017-12-15 ENCOUNTER — Other Ambulatory Visit: Payer: Self-pay

## 2017-12-15 ENCOUNTER — Encounter: Payer: Self-pay | Admitting: Family Medicine

## 2017-12-15 ENCOUNTER — Ambulatory Visit: Payer: BLUE CROSS/BLUE SHIELD | Admitting: Family Medicine

## 2017-12-15 VITALS — BP 130/76 | HR 82 | Temp 98.1°F | Resp 14 | Ht 66.0 in | Wt 266.0 lb

## 2017-12-15 DIAGNOSIS — I10 Essential (primary) hypertension: Secondary | ICD-10-CM

## 2017-12-15 MED ORDER — HYDROCHLOROTHIAZIDE 12.5 MG PO CAPS: 12.5000 mg | ORAL_CAPSULE | Freq: Every day | ORAL | 0 refills | Status: DC

## 2017-12-15 NOTE — Progress Notes (Signed)
Patient ID: Toni Beard, female    DOB: 17-Jan-1961, 57 y.o.   MRN: [id number]  PCP: Salley Scarlet, MD  Chief Complaint  Patient presents with  . Follow-up    Subjective:   Toni Beard is a 57 y.o. female, presents to clinic with CC of HTN recheck and need for refills.  She last saw her PCP, Dr. Jeanice Lim for essential hypertension and morbid obesity 03/30/2017, she was to return in 2 months for recheck and for fasting lab work but she has not been seen since then.  She comes in today saying than usual that they would not refill her medications.  And she believes she is only coming in for a 29-month recheck of her weight with taking phentermine which she did not tolerate and did not like.  Last lab work per chart review was May 2018.  She due for fasting lipids and metabolic panel - per Dr. Rhona Raider last OV note.    She has been taking HCTZ as prescribed, no SE.  No sx of high or low BP, no CP, SOB, palpitations, near syncope.    She is still wearing copper socks, no new sx to b/l LE   Patient Active Problem List   Diagnosis Date Noted  . Varicose veins of both lower extremities 06/28/2016  . Chronic venous hypertension (idiopathic) with ulcer and inflammation of left lower extremity (HCC) 01/05/2016  . Tinnitus 04/02/2015  . Obesity 05/17/2014  . Seasonal allergies   . Carcinoma in situ of cervix uteri 02/06/2013  . Hypertension 01/12/2013  . Contraceptive management 01/12/2013     Prior to Admission medications   Medication Sig Start Date End Date Taking? Authorizing Provider  b complex vitamins tablet Take 1 tablet by mouth daily.   Yes [provider]  calcium carbonate (OS-CAL) 600 MG TABS tablet Take 600 mg by mouth daily with breakfast.   Yes [provider]  cetirizine (ZYRTEC) 10 MG tablet Take 1 tablet (10 mg total) by mouth at bedtime. 05/17/14  Yes Bolan, Velna Hatchet, MD  hydrochlorothiazide (MICROZIDE) 12.5 MG capsule Take 1 capsule (12.5 mg  total) by mouth daily. 12/15/17  Yes Danelle Berry, PA-C     Allergies  Allergen Reactions  . Sulfa Antibiotics Nausea And Vomiting           Review of Systems  Constitutional: Negative.  Negative for activity change, appetite change, fatigue and unexpected weight change.  HENT: Negative.   Eyes: Negative.   Respiratory: Negative.  Negative for shortness of breath.   Cardiovascular: Negative.  Negative for chest pain, palpitations and leg swelling.  Gastrointestinal: Negative.  Negative for abdominal pain and blood in stool.  Endocrine: Negative.   Genitourinary: Negative.   Musculoskeletal: Negative.  Negative for arthralgias, gait problem, joint swelling and myalgias.  Skin: Negative.  Negative for color change, pallor and rash.  Allergic/Immunologic: Negative.   Neurological: Negative.  Negative for syncope and weakness.  Hematological: Negative.   Psychiatric/Behavioral: Negative.  Negative for confusion, dysphoric mood, self-injury and suicidal ideas. The patient is not nervous/anxious.        Objective:    Vitals:   12/15/17 1601  BP: 130/76  Pulse: 82  Resp: 14  Temp: 98.1 F (36.7 C)  TempSrc: Oral  SpO2: 96%  Weight: 266 lb (120.7 kg)  Height: 5\' 6"  (1.676 m)      Physical Exam  Constitutional: She appears well-developed and well-nourished.  Non-toxic appearance. No distress.  Well  appearing obese female  HENT:  Head: Normocephalic and atraumatic.  Right Ear: External ear normal.  Left Ear: External ear normal.  Nose: Nose normal.  Mouth/Throat: Uvula is midline, oropharynx is clear and moist and mucous membranes are normal.  Eyes: Pupils are equal, round, and reactive to light. Conjunctivae and lids are normal. Right eye exhibits no discharge. Left eye exhibits no discharge. No scleral icterus.  Neck: Normal range of motion and phonation normal. Neck supple. No tracheal deviation present.  Cardiovascular: Normal rate, regular rhythm, normal heart  sounds, intact distal pulses and normal pulses. Exam reveals no gallop and no friction rub.  No murmur heard. Pulses:      Radial pulses are 2+ on the right side, and 2+ on the left side.       Posterior tibial pulses are 2+ on the right side, and 2+ on the left side.  No LE edema  Pulmonary/Chest: Effort normal and breath sounds normal. No stridor. No respiratory distress. She has no wheezes. She has no rhonchi. She has no rales. She exhibits no tenderness.  Abdominal: Soft. Normal appearance and bowel sounds are normal. She exhibits no distension. There is no tenderness. There is no rebound and no guarding.  Musculoskeletal: She exhibits no deformity.  Neurological: She is alert. She exhibits normal muscle tone. Coordination and gait normal.  Skin: Skin is warm and intact. No rash noted. She is not diaphoretic.  Psychiatric: She has a normal mood and affect. Her speech is normal and behavior is normal.  Nursing note and vitals reviewed.         Assessment & Plan:      ICD-10-CM   1. Essential hypertension I10 COMPLETE METABOLIC PANEL WITH GFR    CBC with Differential/Platelet    Lipid panel  2. Morbidly obese (HCC) E66.01 COMPLETE METABOLIC PANEL WITH GFR    CBC with Differential/Platelet    Lipid panel    Recheck for HTN, pt needs fasting lab work done, did not f/up.  BP is well controlled, no SE.   30 day supply of HCTZ given, future fasting labs ordered, pt instructed to come get done in the next month and once reviewed 90 d supply with refills will be ordered.    Pt did not like phentermine.  She has not other complaints.  I encouraged her to set up future routine OV or CPE with her PCP for when she returns from maternity leave.     Danelle Berry, PA-C 12/15/17 4:10 PM

## 2017-12-26 ENCOUNTER — Other Ambulatory Visit: Payer: BLUE CROSS/BLUE SHIELD

## 2017-12-26 DIAGNOSIS — I10 Essential (primary) hypertension: Secondary | ICD-10-CM

## 2017-12-26 LAB — LIPID PANEL
CHOL/HDL RATIO: 3.4 (calc) (ref <5.0)
CHOLESTEROL: 172 mg/dL (ref <200)
HDL: 51 mg/dL (ref >50)
LDL CHOLESTEROL (CALC): 102 mg/dL — AB
Non-HDL Cholesterol (Calc): 121 mg/dL (calc) (ref <130)
Triglycerides: 101 mg/dL (ref <150)

## 2017-12-26 LAB — CBC WITH DIFFERENTIAL/PLATELET
BASOS ABS: 32 {cells}/uL (ref 0–200)
Basophils Relative: 0.4 %
EOS ABS: 211 {cells}/uL (ref 15–500)
Eosinophils Relative: 2.6 %
HCT: 42 % (ref 35.0–45.0)
Hemoglobin: 14.2 g/dL (ref 11.7–15.5)
Lymphs Abs: 2633 cells/uL (ref 850–3900)
MCH: 30.1 pg (ref 27.0–33.0)
MCHC: 33.8 g/dL (ref 32.0–36.0)
MCV: 89.2 fL (ref 80.0–100.0)
MONOS PCT: 5.7 %
MPV: 10.2 fL (ref 7.5–12.5)
Neutro Abs: 4763 cells/uL (ref 1500–7800)
Neutrophils Relative %: 58.8 %
PLATELETS: 296 10*3/uL (ref 140–400)
RBC: 4.71 10*6/uL (ref 3.80–5.10)
RDW: 12.8 % (ref 11.0–15.0)
TOTAL LYMPHOCYTE: 32.5 %
WBC mixed population: 462 cells/uL (ref 200–950)
WBC: 8.1 10*3/uL (ref 3.8–10.8)

## 2017-12-26 LAB — COMPLETE METABOLIC PANEL WITH GFR
AG Ratio: 1.5 (calc) (ref 1.0–2.5)
ALKALINE PHOSPHATASE (APISO): 89 U/L (ref 33–130)
ALT: 12 U/L (ref 6–29)
AST: 16 U/L (ref 10–35)
Albumin: 4 g/dL (ref 3.6–5.1)
BILIRUBIN TOTAL: 0.7 mg/dL (ref 0.2–1.2)
BUN: 13 mg/dL (ref 7–25)
CHLORIDE: 102 mmol/L (ref 98–110)
CO2: 31 mmol/L (ref 20–32)
Calcium: 9.6 mg/dL (ref 8.6–10.4)
Creat: 0.83 mg/dL (ref 0.50–1.05)
GFR, EST AFRICAN AMERICAN: 91 mL/min/{1.73_m2} (ref >=60)
GFR, Est Non African American: 79 mL/min/{1.73_m2} (ref >=60)
GLUCOSE: 84 mg/dL (ref 65–99)
Globulin: 2.7 g/dL (calc) (ref 1.9–3.7)
POTASSIUM: 4.3 mmol/L (ref 3.5–5.3)
SODIUM: 141 mmol/L (ref 135–146)
Total Protein: 6.7 g/dL (ref 6.1–8.1)

## 2017-12-28 ENCOUNTER — Encounter: Payer: Self-pay | Admitting: *Deleted

## 2018-01-10 ENCOUNTER — Other Ambulatory Visit: Payer: Self-pay | Admitting: Family Medicine

## 2018-01-12 ENCOUNTER — Encounter: Payer: Self-pay | Admitting: Family Medicine

## 2018-01-12 MED ORDER — HYDROCHLOROTHIAZIDE 12.5 MG PO CAPS: 12.5000 mg | ORAL_CAPSULE | Freq: Every day | ORAL | 3 refills | Status: DC

## 2018-01-23 ENCOUNTER — Encounter: Payer: Self-pay | Admitting: Women's Health

## 2018-01-23 ENCOUNTER — Ambulatory Visit: Payer: BLUE CROSS/BLUE SHIELD | Admitting: Women's Health

## 2018-01-23 ENCOUNTER — Other Ambulatory Visit: Payer: Self-pay

## 2018-01-23 VITALS — BP 133/77 | HR 86 | Ht 66.0 in | Wt 263.0 lb

## 2018-01-23 DIAGNOSIS — N9089 Other specified noninflammatory disorders of vulva and perineum: Secondary | ICD-10-CM | POA: Diagnosis not present

## 2018-01-23 DIAGNOSIS — R300 Dysuria: Secondary | ICD-10-CM

## 2018-01-23 LAB — POCT URINALYSIS DIPSTICK OB
Glucose, UA: NEGATIVE
KETONES UA: NEGATIVE
Nitrite, UA: NEGATIVE

## 2018-01-23 MED ORDER — VALACYCLOVIR HCL 1 G PO TABS: 1000.0000 mg | ORAL_TABLET | Freq: Two times a day (BID) | ORAL | 0 refills | Status: DC

## 2018-01-23 MED ORDER — FLUCONAZOLE 100 MG PO TABS: 100.0000 mg | ORAL_TABLET | Freq: Every day | ORAL | 0 refills | Status: DC

## 2018-01-23 NOTE — Progress Notes (Signed)
GYN VISIT Patient name: Toni Beard MRN [id number]  Date of birth: [birth year] Chief Complaint:   vaginal soreness (no c/o itching or discharge)  History of Present Illness:   Toni Beard is a 57 y.o. G1P1 Caucasian female being seen today for report of vulvar soreness x 1 week, hurts to sit, feels like it goes all the way to tailbone, burns when she voids. Has urinary frequency and hesitancy. Denies abnormal discharge, itching/odor/irritation. Has been applying coconut oil, which helps to soothe some.  Is still sexually active. Is postmenopausal. 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 H/O cervical conization/laser ablation Jan 2105 w/ HSIL, CIN II-III involving endocervical glands, had repeat pap 09/24/13 LSIL/HPV, was supposed to repeat in , did not f/u.     No LMP recorded. Patient is postmenopausal. The current method of family planning is post menopausal status. Last pap 09/24/13. Results were:  LSIL/HPV Review of Systems:   Pertinent items are noted in HPI Denies fever/chills, dizziness, headaches, visual disturbances, fatigue, shortness of breath, chest pain, abdominal pain, vomiting, abnormal vaginal discharge/itching/odor/irritation, problems with periods, bowel movements, urination, or intercourse unless otherwise stated above.  Pertinent History Reviewed:  Reviewed past medical,surgical, social, obstetrical and family history.  Reviewed problem list, medications and allergies. Physical Assessment:   Vitals:   01/23/18 1544  BP: 133/77  Pulse: 86  Weight: 263 lb (119.3 kg)  Height: 5\' 6"  (1.676 m)  Body mass index is 42.45 kg/m.       Physical Examination:   General appearance: alert, well appearing, and in no distress  Mental status: alert, oriented to person, place, and time  Skin: warm & dry   Cardiovascular: normal heart rate noted  Respiratory: normal respiratory effort, no distress  Abdomen: soft, non-tender   Pelvic: VULVA: yeast in skin folds of upper thighs/legs,  vulva erythematous, ulcerated lesion Rt lower labia majora towards buttocks, multiple ulcerated lesions at introitus, culture collected. Bled when touched w/ swab, very tender. Internal exam deferred d/t pain.   Extremities: no edema   Results for orders placed or performed in visit on 01/23/18 (from the past 24 hour(s))  POC Urinalysis Dipstick OB   Collection Time: 01/23/18  3:52 PM  Result Value Ref Range   Color, UA     Clarity, UA     Glucose, UA Negative Negative   Bilirubin, UA     Ketones, UA neg    Spec Grav, UA     Blood, UA small    pH, UA     POC,PROTEIN,UA Small (1+) Negative, Trace, Small (1+), Moderate (2+), Large (3+), 4+   Urobilinogen, UA     Nitrite, UA neg    Leukocytes, UA Moderate (2+) (A) Negative   Appearance     Odor      Assessment & Plan:  1) Multiple vulvar ulcerated lesions> suspicious for HSV, culture obtained/sent, pt denies hx. Rx valtrex 1gm BID x 10d, if not improving by end of week let us know. F/U in 2wks  2) Intertrigal candida> did not paint w/ gentian violet d/t vulvar lesions, rx diflucan  3) H/O abnormal pap> conization/laser ablation d/t HSIL, CIN II-III, 4years past due for f/u, scheduled for 12/18  4) Burning w/ urination> frequency/hesitancy, could be related to lesions, will send urine cx  Meds:  Meds ordered this encounter  Medications  . fluconazole (DIFLUCAN) 100 MG tablet    Sig: Take 1 tablet (100 mg total) by mouth daily.    Dispense:  1  tablet    Refill:  0    Order Specific Question:   Supervising Provider    Answer:   Despina HiddenEURE, LUTHER H [[id number]]  . valACYclovir (VALTREX) 1000 MG tablet    Sig: Take 1 tablet (1,000 mg total) by mouth 2 (two) times daily. X 10 days    Dispense:  20 tablet    Refill:  0    Order Specific Question:   Supervising Provider    Answer:   Duane LopeEURE, LUTHER H [[id number]]    Orders Placed This Encounter  Procedures  . Urine Culture  . Herpes simplex virus culture  . POC Urinalysis Dipstick OB     Return for ASAP for pap & physical.  Cheral MarkerKimberly R Faizan Geraci CNM, [id number] 01/23/2018 4:24 PM

## 2018-01-25 LAB — URINE CULTURE

## 2018-01-26 ENCOUNTER — Other Ambulatory Visit: Payer: Self-pay | Admitting: Women's Health

## 2018-01-26 LAB — HERPES SIMPLEX VIRUS CULTURE

## 2018-02-08 ENCOUNTER — Other Ambulatory Visit (HOSPITAL_COMMUNITY)
Admission: RE | Admit: 2018-02-08 | Discharge: 2018-02-08 | Disposition: A | Discharge status: Home / Self Care | Payer: BLUE CROSS/BLUE SHIELD | Source: Ambulatory Visit | Attending: Obstetrics and Gynecology | Admitting: Obstetrics and Gynecology

## 2018-02-08 ENCOUNTER — Encounter: Payer: Self-pay | Admitting: Obstetrics and Gynecology

## 2018-02-08 ENCOUNTER — Ambulatory Visit (INDEPENDENT_AMBULATORY_CARE_PROVIDER_SITE_OTHER): Payer: BLUE CROSS/BLUE SHIELD | Admitting: Obstetrics and Gynecology

## 2018-02-08 VITALS — BP 114/70 | HR 81 | Ht 66.0 in | Wt 258.4 lb

## 2018-02-08 DIAGNOSIS — Z1211 Encounter for screening for malignant neoplasm of colon: Secondary | ICD-10-CM

## 2018-02-08 DIAGNOSIS — Z1212 Encounter for screening for malignant neoplasm of rectum: Secondary | ICD-10-CM

## 2018-02-08 DIAGNOSIS — Z01419 Encounter for gynecological examination (general) (routine) without abnormal findings: Secondary | ICD-10-CM | POA: Insufficient documentation

## 2018-02-08 LAB — HEMOCCULT GUIAC POC 1CARD (OFFICE): FECAL OCCULT BLD: NEGATIVE

## 2018-02-08 NOTE — Progress Notes (Signed)
Patient ID: Toni Beard, female   DOB: [birth year], 57 y.o.   MRN: [id number]  Assessment:  Annual Gyn Exam Plan:  1. pap smear done, next pap due 3 years 2. return annually or prn 3    Annual mammogram advised after age 57 Subjective:  Toni Beard is a 57 y.o. female G1P1 who presents for annual exam. No LMP recorded. Patient is postmenopausal. The patient has complaints today of none. [facility]  The following portions of the patient's history were reviewed and updated as appropriate: allergies, current medications, past family history, past medical history, past social history, past surgical history and problem list. Past Medical History:  Diagnosis Date  . Allergy    seasonal  . Contraceptive management 01/12/2013  . Hypertension   . Obesity     Past Surgical History:  Procedure Laterality Date  . CERVICAL CONIZATION W/BX N/A 03/14/2013   Procedure: CONIZATION CERVIX ;  Surgeon: Lazaro ArmsLuther H Eure, MD;  Location: [facility];  Service: Gynecology;  Laterality: N/A;  . HOLMIUM LASER APPLICATION N/A 03/14/2013   Procedure: HOLMIUM LASER APPLICATION;  Surgeon: Lazaro ArmsLuther H Eure, MD;  Location: [facility];  Service: Gynecology;  Laterality: N/A;     Current Outpatient Medications:  .  b complex vitamins tablet, Take 1 tablet by mouth daily., Disp: , Rfl:  .  calcium carbonate (OS-CAL) 600 MG TABS tablet, Take 600 mg by mouth daily with breakfast., Disp: , Rfl:  .  cetirizine (ZYRTEC) 10 MG tablet, Take 1 tablet (10 mg total) by mouth at bedtime., Disp: 30 tablet, Rfl: 11 .  hydrochlorothiazide (MICROZIDE) 12.5 MG capsule, Take 1 capsule (12.5 mg total) by mouth daily., Disp: 90 capsule, Rfl: 3  Review of Systems Constitutional: negative Gastrointestinal: negative Genitourinary: normal  Objective:  BP 114/70 (BP Location: Left Arm, Patient Position: Sitting, Cuff Size: Normal)   Pulse 81   Ht 5\' 6"  (1.676 m)   Wt 258 lb 6.4 oz (117.2 kg)   BMI 41.71 kg/m     BMI: Body mass index is 41.71 kg/m.  General Appearance: Alert, appropriate appearance for age. No acute distress HEENT: Grossly normal Neck / Thyroid: skin tags  Cardiovascular: RRR; normal S1, S2, no murmur Lungs: CTA bilaterally Back: No CVAT Breast Exam: Normal to inspection and No masses or nodes.No dimpling, nipple retraction or discharge. Skin tags Gastrointestinal: Soft, non-tender, no masses or organomegaly Pelvic Exam:  VULVA: multiple vulvar sebaceous cyst VAGINA: normal in appearance CERVIX: multip normal in appearance UTERUS:mobile, nontender RECTAL: guaiac negative stool obtained,  PAP: Pap smear done today. Lymphatic Exam: Non-palpable nodes in neck, clavicular, axillary, or inguinal regions Skin: no rash or abnormalities Neurologic: Normal gait and speech, no tremor  Psychiatric: Alert and oriented, appropriate affect.  Urinalysis:Not done  By signing my name below, I, Arnette NorrisMari Johnson, attest that this documentation has been prepared under the direction and in the presence of Tilda BurrowFerguson, Anjelika Ausburn V, MD. Electronically Signed: Arnette NorrisMari Johnson Medical Scribe. 02/08/18. 1:44 PM.  I personally performed the services described in this documentation, which was SCRIBED in my presence. The recorded information has been reviewed and considered accurate. It has been edited as necessary during review. Tilda BurrowJohn V Presleigh Feldstein, MD

## 2018-02-08 NOTE — Addendum Note (Signed)
Addended by: Colen DarlingYOUNG, Nakiya Rallis S on: 02/08/2018 02:51 PM   Modules accepted: Orders

## 2018-02-13 LAB — CYTOLOGY - PAP
DIAGNOSIS: NEGATIVE
HPV (WINDOPATH): NOT DETECTED

## 2018-07-07 ENCOUNTER — Other Ambulatory Visit: Payer: Self-pay | Admitting: Family Medicine

## 2018-07-13 ENCOUNTER — Other Ambulatory Visit: Payer: Self-pay

## 2018-07-14 ENCOUNTER — Encounter: Payer: BLUE CROSS/BLUE SHIELD | Admitting: Family Medicine

## 2018-08-29 ENCOUNTER — Encounter: Payer: Self-pay | Admitting: Family Medicine

## 2018-09-19 ENCOUNTER — Other Ambulatory Visit: Payer: Self-pay

## 2018-09-20 ENCOUNTER — Encounter: Payer: Self-pay | Admitting: Family Medicine

## 2018-09-20 ENCOUNTER — Ambulatory Visit (INDEPENDENT_AMBULATORY_CARE_PROVIDER_SITE_OTHER): Payer: PRIVATE HEALTH INSURANCE | Admitting: Family Medicine

## 2018-09-20 VITALS — BP 132/74 | HR 70 | Temp 97.9°F | Resp 14 | Ht 66.0 in | Wt 266.0 lb

## 2018-09-20 DIAGNOSIS — Z23 Encounter for immunization: Secondary | ICD-10-CM | POA: Diagnosis not present

## 2018-09-20 DIAGNOSIS — Z114 Encounter for screening for human immunodeficiency virus [HIV]: Secondary | ICD-10-CM

## 2018-09-20 DIAGNOSIS — Z0001 Encounter for general adult medical examination with abnormal findings: Secondary | ICD-10-CM | POA: Diagnosis not present

## 2018-09-20 DIAGNOSIS — I8393 Asymptomatic varicose veins of bilateral lower extremities: Secondary | ICD-10-CM | POA: Diagnosis not present

## 2018-09-20 DIAGNOSIS — I10 Essential (primary) hypertension: Secondary | ICD-10-CM | POA: Diagnosis not present

## 2018-09-20 DIAGNOSIS — Z0000 Encounter for general adult medical examination without abnormal findings: Secondary | ICD-10-CM

## 2018-09-20 DIAGNOSIS — Z6841 Body Mass Index (BMI) 40.0 and over, adult: Secondary | ICD-10-CM

## 2018-09-20 DIAGNOSIS — Z1239 Encounter for other screening for malignant neoplasm of breast: Secondary | ICD-10-CM

## 2018-09-20 DIAGNOSIS — Z1159 Encounter for screening for other viral diseases: Secondary | ICD-10-CM

## 2018-09-20 MED ORDER — HYDROCHLOROTHIAZIDE 12.5 MG PO CAPS: 12.5000 mg | ORAL_CAPSULE | Freq: Every day | ORAL | 2 refills | Status: DC

## 2018-09-20 NOTE — Assessment & Plan Note (Signed)
Blood pressure is controlled fasting labs to be done today.

## 2018-09-20 NOTE — Assessment & Plan Note (Signed)
She is working on nutritional changes with [employer]

## 2018-09-20 NOTE — Progress Notes (Signed)
Subjective:    Patient ID: Toni Beard, female    DOB: 27-Jan-1961, 58 y.o.   MRN: [id number]  Patient presents for Annual Exam (is fasting)   Pt here for CPE medications reviewed  History reviewed  Right foot discomfort, oftens feels like its swollen  at base of toes , no injury.  She does have chronic peripheral edema often wears compression hose which helps she is also on hydrochlorothiazide which helps with her blood pressure too.  Obesity per the last note she did not tolerate phentermine so she is now back on weight watchers for the past 3 weeks   Mammogram - due 2018  Colonoscpy UTD done  2018   Taking calcium/Vitamin D   Review Of Systems:  GEN- denies fatigue, fever, weight loss,weakness, recent illness HEENT- denies eye drainage, change in vision, nasal discharge, CVS- denies chest pain, palpitations RESP- denies SOB, cough, wheeze ABD- denies N/V, change in stools, abd pain GU- denies dysuria, hematuria, dribbling, incontinence MSK- denies joint pain, muscle aches, injury Neuro- denies headache, dizziness, syncope, seizure activity       Objective:    BP 132/74   Pulse 70   Temp 97.9 F (36.6 C) (Oral)   Resp 14   Ht 5\' 6"  (1.676 m)   Wt 266 lb (120.7 kg)   SpO2 97%   BMI 42.93 kg/m  GEN- NAD, alert and oriented x3,obese  HEENT- PERRL, EOMI, non injected sclera, pink conjunctiva, MMM, oropharynx clear Neck- Supple, no thyromegaly CVS- RRR, no murmur RESP-CTAB ABD-NABS,soft,NT,ND EXT-mild pedal edema bilaterally foot normal inspection right side good range of motion at the ankle.  No bruising noted able to flex all the toes.  No specific deformity of toes noted but nontender Pulses- Radial, DP- 2+        Assessment & Plan:     FALL/AUDIT C/DEPRESSION SCREEN NEG Problem List Items Addressed This Visit      Unprioritized   Hypertension    Blood pressure is controlled fasting labs to be done today.      Relevant Medications   hydrochlorothiazide (MICROZIDE) 12.5 MG capsule   Other Relevant Orders   CBC with Differential/Platelet   Comprehensive metabolic panel   Obesity    She is working on nutritional changes with weight watchers      Relevant Orders   Lipid panel   Varicose veins of both lower extremities    She may have some arthritis in the foot also has chronic edema so the pressure swelling can also cause foot discomfort.  At this time she just can wear her compression socks      Relevant Medications   hydrochlorothiazide (MICROZIDE) 12.5 MG capsule    Other Visit Diagnoses    Routine general medical examination at a health care facility    -  Primary   CPE done, pt to schedule mammogram, Shingrix vaccine given, Hep C/HIV due    Relevant Orders   Lipid panel   Need for hepatitis C screening test       Relevant Orders   Hepatitis C antibody   Encounter for screening for HIV       Relevant Orders   HIV Antibody (routine testing w rflx)   Breast cancer screening       Relevant Orders   MM 3D SCREEN BREAST BILATERAL   Need for shingles vaccine          Note: This dictation was prepared with Dragon dictation along with smaller phrase  technology. Any transcriptional errors that result from this process are unintentional.

## 2018-09-20 NOTE — Patient Instructions (Signed)
We will call with results Schedule your mammogram F/U 1 year for Physical

## 2018-09-20 NOTE — Assessment & Plan Note (Signed)
She may have some arthritis in the foot also has chronic edema so the pressure swelling can also cause foot discomfort.  At this time she just can wear her compression socks

## 2018-09-21 LAB — LIPID PANEL
Cholesterol: 186 mg/dL (ref <200)
HDL: 51 mg/dL (ref >=50)
LDL Cholesterol (Calc): 115 mg/dL (calc) — ABNORMAL HIGH
Non-HDL Cholesterol (Calc): 135 mg/dL (calc) — ABNORMAL HIGH (ref <130)
Total CHOL/HDL Ratio: 3.6 (calc) (ref <5.0)
Triglycerides: 96 mg/dL (ref <150)

## 2018-09-21 LAB — CBC WITH DIFFERENTIAL/PLATELET
Absolute Monocytes: 525 cells/uL (ref 200–950)
Basophils Absolute: 36 cells/uL (ref 0–200)
Basophils Relative: 0.4 %
Eosinophils Absolute: 338 cells/uL (ref 15–500)
Eosinophils Relative: 3.8 %
HCT: 40.1 % (ref 35.0–45.0)
Hemoglobin: 14 g/dL (ref 11.7–15.5)
Lymphs Abs: 2225 cells/uL (ref 850–3900)
MCH: 31.5 pg (ref 27.0–33.0)
MCHC: 34.9 g/dL (ref 32.0–36.0)
MCV: 90.1 fL (ref 80.0–100.0)
MPV: 10.1 fL (ref 7.5–12.5)
Monocytes Relative: 5.9 %
Neutro Abs: 5776 cells/uL (ref 1500–7800)
Neutrophils Relative %: 64.9 %
Platelets: 299 10*3/uL (ref 140–400)
RBC: 4.45 10*6/uL (ref 3.80–5.10)
RDW: 12.8 % (ref 11.0–15.0)
Total Lymphocyte: 25 %
WBC: 8.9 10*3/uL (ref 3.8–10.8)

## 2018-09-21 LAB — HEPATITIS C ANTIBODY
Hepatitis C Ab: NONREACTIVE
SIGNAL TO CUT-OFF: 0.01 (ref <1.00)

## 2018-09-21 LAB — COMPREHENSIVE METABOLIC PANEL
AG Ratio: 1.6 (calc) (ref 1.0–2.5)
ALT: 9 U/L (ref 6–29)
AST: 12 U/L (ref 10–35)
Albumin: 4 g/dL (ref 3.6–5.1)
Alkaline phosphatase (APISO): 74 U/L (ref 37–153)
BUN: 15 mg/dL (ref 7–25)
CO2: 28 mmol/L (ref 20–32)
Calcium: 9.4 mg/dL (ref 8.6–10.4)
Chloride: 104 mmol/L (ref 98–110)
Creat: 0.68 mg/dL (ref 0.50–1.05)
Globulin: 2.5 g/dL (calc) (ref 1.9–3.7)
Glucose, Bld: 85 mg/dL (ref 65–99)
Potassium: 3.8 mmol/L (ref 3.5–5.3)
Sodium: 142 mmol/L (ref 135–146)
Total Bilirubin: 0.4 mg/dL (ref 0.2–1.2)
Total Protein: 6.5 g/dL (ref 6.1–8.1)

## 2018-09-21 LAB — HIV ANTIBODY (ROUTINE TESTING W REFLEX): HIV 1&2 Ab, 4th Generation: NONREACTIVE

## 2018-09-22 ENCOUNTER — Encounter: Payer: Self-pay | Admitting: *Deleted

## 2018-11-09 ENCOUNTER — Other Ambulatory Visit: Payer: Self-pay

## 2018-11-09 ENCOUNTER — Ambulatory Visit
Admission: RE | Admit: 2018-11-09 | Discharge: 2018-11-09 | Disposition: A | Discharge status: Home / Self Care | Payer: PRIVATE HEALTH INSURANCE | Source: Ambulatory Visit | Attending: Family Medicine | Admitting: Family Medicine

## 2018-11-09 DIAGNOSIS — Z1239 Encounter for other screening for malignant neoplasm of breast: Secondary | ICD-10-CM

## 2019-03-21 ENCOUNTER — Ambulatory Visit (INDEPENDENT_AMBULATORY_CARE_PROVIDER_SITE_OTHER): Payer: 59

## 2019-03-21 ENCOUNTER — Other Ambulatory Visit: Payer: Self-pay

## 2019-03-21 DIAGNOSIS — Z23 Encounter for immunization: Secondary | ICD-10-CM

## 2019-04-02 ENCOUNTER — Telehealth: Payer: Self-pay | Admitting: Orthopedic Surgery

## 2019-04-02 ENCOUNTER — Other Ambulatory Visit: Payer: Self-pay

## 2019-04-02 ENCOUNTER — Ambulatory Visit: Payer: 59 | Attending: Internal Medicine

## 2019-04-02 DIAGNOSIS — Z20822 Contact with and (suspected) exposure to covid-19: Secondary | ICD-10-CM

## 2019-04-02 NOTE — Telephone Encounter (Signed)
Patient called requesting for Dr. Lajoyce Corners to call her. Patient states Dr. Lajoyce Corners invented compression socks and patient is trying to find where they are sold now. Patient states she use to get socks from a pharmacy off [address] but pharmacy is closed. Patient also states she see Barnie Del PA when script was given to her years ago. Patient phone number  is [phone].

## 2019-04-03 LAB — NOVEL CORONAVIRUS, NAA: SARS-CoV-2, NAA: NOT DETECTED

## 2019-04-03 NOTE — Telephone Encounter (Signed)
I called pt and advised that socks can be purchased from [facility] or [facility]. Pt will call with any other questions.

## 2019-05-12 ENCOUNTER — Ambulatory Visit: Payer: 59 | Attending: Internal Medicine

## 2019-05-12 DIAGNOSIS — Z23 Encounter for immunization: Secondary | ICD-10-CM

## 2019-05-12 NOTE — Progress Notes (Signed)
   Covid-19 Vaccination Clinic  Name:  [name]    MRN: [id number] DOB: 01-Jan-1961  05/12/2019  [name] was observed post Covid-19 immunization for 15 minutes without incident. She was provided with Vaccine Information Sheet and instruction to access the V-Safe system.   [name] was instructed to call 911 with any severe reactions post vaccine: Marland Kitchen Difficulty breathing  . Swelling of face and throat  . A fast heartbeat  . A bad rash all over body  . Dizziness and weakness   Immunizations Administered    Name Date Dose VIS Date Route   Moderna COVID-19 Vaccine 05/12/2019 10:06 AM 0.5 mL 01/23/2019 Intramuscular   Manufacturer: Moderna   Lot: [id number]   NDC: 82800-349-17

## 2019-06-13 ENCOUNTER — Ambulatory Visit: Payer: 59 | Attending: Internal Medicine

## 2019-06-13 DIAGNOSIS — Z23 Encounter for immunization: Secondary | ICD-10-CM

## 2019-06-13 NOTE — Progress Notes (Signed)
   Covid-19 Vaccination Clinic  Name:  MACIE BAUM    MRN: [id number] DOB: 1960/07/27  06/13/2019  Ms. Somoza was observed post Covid-19 immunization for 15 minutes without incident. She was provided with Vaccine Information Sheet and instruction to access the V-Safe system.   Ms. Durand was instructed to call 911 with any severe reactions post vaccine: Marland Kitchen Difficulty breathing  . Swelling of face and throat  . A fast heartbeat  . A bad rash all over body  . Dizziness and weakness   Immunizations Administered    Name Date Dose VIS Date Route   Moderna COVID-19 Vaccine 06/13/2019  3:13 PM 0.5 mL 01/2019 Intramuscular   Manufacturer: Moderna   Lot: [id number]   NDC: 52591-028-90

## 2019-07-10 ENCOUNTER — Telehealth: Payer: Self-pay | Admitting: Family Medicine

## 2019-08-02 NOTE — Telephone Encounter (Signed)
error 

## 2019-09-24 ENCOUNTER — Encounter: Payer: Self-pay | Admitting: Family Medicine

## 2019-09-24 ENCOUNTER — Ambulatory Visit (INDEPENDENT_AMBULATORY_CARE_PROVIDER_SITE_OTHER): Payer: 59 | Admitting: Family Medicine

## 2019-09-24 ENCOUNTER — Other Ambulatory Visit: Payer: Self-pay

## 2019-09-24 VITALS — BP 130/74 | HR 84 | Temp 98.3°F | Resp 14 | Ht 66.0 in | Wt 274.0 lb

## 2019-09-24 DIAGNOSIS — I10 Essential (primary) hypertension: Secondary | ICD-10-CM

## 2019-09-24 DIAGNOSIS — E669 Obesity, unspecified: Secondary | ICD-10-CM

## 2019-09-24 DIAGNOSIS — Z1231 Encounter for screening mammogram for malignant neoplasm of breast: Secondary | ICD-10-CM | POA: Diagnosis not present

## 2019-09-24 DIAGNOSIS — Z0001 Encounter for general adult medical examination with abnormal findings: Secondary | ICD-10-CM | POA: Diagnosis not present

## 2019-09-24 DIAGNOSIS — I8393 Asymptomatic varicose veins of bilateral lower extremities: Secondary | ICD-10-CM

## 2019-09-24 DIAGNOSIS — Z0000 Encounter for general adult medical examination without abnormal findings: Secondary | ICD-10-CM

## 2019-09-24 NOTE — Assessment & Plan Note (Signed)
Continue compression hose.   

## 2019-09-24 NOTE — Progress Notes (Signed)
   Subjective:    Patient ID: Toni Beard, female    DOB: 01/08/1961, 59 y.o.   MRN: [id number]  Patient presents for Annual Exam (is fasting)   Pt here for CPE   Medications and history reviewed     HTN- taking HCTZ 12.5mg  once a day , varicose veins wears compression hose    Obesity- weight up8lbs since July 2020  Immunizations- TDAP/COVID-19/shingles  UTD Mammogram UTD Sept 2020 Colonoscopy UTD PAP smear - followed by [facility] 2019   [facility]- Eye Doctor   Dentist UTD    No concerns  Review Of Systems:  GEN- denies fatigue, fever, weight loss,weakness, recent illness HEENT- denies eye drainage, change in vision, nasal discharge, CVS- denies chest pain, palpitations RESP- denies SOB, cough, wheeze ABD- denies N/V, change in stools, abd pain GU- denies dysuria, hematuria, dribbling, incontinence MSK- denies joint pain, muscle aches, injury Neuro- denies headache, dizziness, syncope, seizure activity       Objective:    BP (!) 130/74   Pulse 84   Temp 98.3 F (36.8 C) (Temporal)   Resp 14   Ht 5\' 6"  (1.676 m)   Wt (!) 274 lb (124.3 kg)   SpO2 98%   BMI 44.22 kg/m  GEN- NAD, alert and oriented x3 HEENT- PERRL, EOMI, non injected sclera, pink conjunctiva, MMM, oropharynx clear Neck- Supple, no thyromegaly CVS- RRR, no murmur RESP-CTAB ABD-NABS,soft,NT,ND Psych- normal affect and mood , PHQ 9 score 0  EXT- No edema Pulses- Radial, DP- 2+   FALL/AUDIT C/DEPRESSION Screen ne g     Assessment & Plan:      Problem List Items Addressed This Visit      Unprioritized   Class 3 obesity    Discussed dietary changes, increase water Reduce sweets, eating out Check A1C and TSH with class 3 obesity and HTN at increased risk for CAD      Relevant Orders   Hemoglobin A1c   Hypertension    Controlled no changes       Relevant Orders   TSH   Varicose veins of both lower extremities    Continue compression hose        Other Visit Diagnoses     Routine general medical examination at a health care facility    -  Primary   CPE done, fasting labs, prevention UTD   Relevant Orders   CBC with Differential/Platelet   Comprehensive metabolic panel   Lipid panel   TSH   Hemoglobin A1c   Encounter for screening mammogram for malignant neoplasm of breast       Relevant Orders   MM 3D SCREEN BREAST BILATERAL      Note: This dictation was prepared with Dragon dictation along with smaller phrase technology. Any transcriptional errors that result from this process are unintentional.

## 2019-09-24 NOTE — Assessment & Plan Note (Signed)
Discussed dietary changes, increase water Reduce sweets, eating out Check A1C and TSH with class 3 obesity and HTN at increased risk for CAD

## 2019-09-24 NOTE — Assessment & Plan Note (Signed)
Controlled no changes 

## 2019-09-24 NOTE — Patient Instructions (Addendum)
We will call with lab results Schedule your mammogram for end of September  F/U 1 year for Physical

## 2019-09-25 LAB — LIPID PANEL
Cholesterol: 180 mg/dL (ref <200)
HDL: 57 mg/dL (ref >=50)
LDL Cholesterol (Calc): 104 mg/dL (calc) — ABNORMAL HIGH
Non-HDL Cholesterol (Calc): 123 mg/dL (calc) (ref <130)
Total CHOL/HDL Ratio: 3.2 (calc) (ref <5.0)
Triglycerides: 96 mg/dL (ref <150)

## 2019-09-25 LAB — CBC WITH DIFFERENTIAL/PLATELET
Absolute Monocytes: 466 cells/uL (ref 200–950)
Basophils Absolute: 29 cells/uL (ref 0–200)
Basophils Relative: 0.3 %
Eosinophils Absolute: 342 cells/uL (ref 15–500)
Eosinophils Relative: 3.6 %
HCT: 43 % (ref 35.0–45.0)
Hemoglobin: 14 g/dL (ref 11.7–15.5)
Lymphs Abs: 2727 cells/uL (ref 850–3900)
MCH: 30 pg (ref 27.0–33.0)
MCHC: 32.6 g/dL (ref 32.0–36.0)
MCV: 92.3 fL (ref 80.0–100.0)
MPV: 10.4 fL (ref 7.5–12.5)
Monocytes Relative: 4.9 %
Neutro Abs: 5938 cells/uL (ref 1500–7800)
Neutrophils Relative %: 62.5 %
Platelets: 283 10*3/uL (ref 140–400)
RBC: 4.66 10*6/uL (ref 3.80–5.10)
RDW: 13.1 % (ref 11.0–15.0)
Total Lymphocyte: 28.7 %
WBC: 9.5 10*3/uL (ref 3.8–10.8)

## 2019-09-25 LAB — COMPREHENSIVE METABOLIC PANEL
AG Ratio: 1.4 (calc) (ref 1.0–2.5)
ALT: 11 U/L (ref 6–29)
AST: 16 U/L (ref 10–35)
Albumin: 3.9 g/dL (ref 3.6–5.1)
Alkaline phosphatase (APISO): 79 U/L (ref 37–153)
BUN: 10 mg/dL (ref 7–25)
CO2: 32 mmol/L (ref 20–32)
Calcium: 9.8 mg/dL (ref 8.6–10.4)
Chloride: 101 mmol/L (ref 98–110)
Creat: 0.73 mg/dL (ref 0.50–1.05)
Globulin: 2.7 g/dL (calc) (ref 1.9–3.7)
Glucose, Bld: 85 mg/dL (ref 65–99)
Potassium: 3.7 mmol/L (ref 3.5–5.3)
Sodium: 140 mmol/L (ref 135–146)
Total Bilirubin: 0.4 mg/dL (ref 0.2–1.2)
Total Protein: 6.6 g/dL (ref 6.1–8.1)

## 2019-09-25 LAB — HEMOGLOBIN A1C
Hgb A1c MFr Bld: 5.4 % of total Hgb (ref <5.7)
Mean Plasma Glucose: 108 (calc)
eAG (mmol/L): 6 (calc)

## 2019-09-25 LAB — TSH: TSH: 4.02 mIU/L (ref 0.40–4.50)

## 2019-09-27 ENCOUNTER — Encounter: Payer: Self-pay | Admitting: *Deleted

## 2019-10-09 ENCOUNTER — Other Ambulatory Visit: Payer: Self-pay | Admitting: Family Medicine

## 2020-01-15 ENCOUNTER — Ambulatory Visit: Payer: 59 | Admitting: Family Medicine

## 2020-01-15 ENCOUNTER — Other Ambulatory Visit: Payer: Self-pay

## 2020-01-15 VITALS — BP 142/78 | HR 88 | Temp 97.8°F | Resp 16 | Ht 66.0 in | Wt 274.0 lb

## 2020-01-15 DIAGNOSIS — Z23 Encounter for immunization: Secondary | ICD-10-CM | POA: Diagnosis not present

## 2020-01-15 DIAGNOSIS — M25561 Pain in right knee: Secondary | ICD-10-CM | POA: Diagnosis not present

## 2020-01-15 DIAGNOSIS — M25562 Pain in left knee: Secondary | ICD-10-CM

## 2020-01-15 DIAGNOSIS — M1712 Unilateral primary osteoarthritis, left knee: Secondary | ICD-10-CM | POA: Diagnosis not present

## 2020-01-15 DIAGNOSIS — G8929 Other chronic pain: Secondary | ICD-10-CM

## 2020-01-15 MED ORDER — MELOXICAM 7.5 MG PO TABS: 7.5000 mg | ORAL_TABLET | Freq: Every day | ORAL | 1 refills | Status: DC

## 2020-01-15 NOTE — Patient Instructions (Addendum)
Referral To Dr. Lajoyce Corners for your knee Take meloxicam with food  F/U as previous

## 2020-01-15 NOTE — Progress Notes (Signed)
   Subjective:    Patient ID: Toni Beard, female    DOB: May 04, 1960, 60 y.o.   MRN: [id number]  Patient presents for Knee Pain (pt reports Ly knee causing pain and making popping sounds, causing her to walk funny causing pain in other knee and her back )   Pt here  With left knee pain and left anke pain  she has been wearing compression   bilat knee poppping sessation, she fessl like she has been walking different   she has fear knees the will give out   No swelling in the knee  Left knee medially has more pain She has been wearing knee brace  Mag and potassium , tylenol/aleve         Review Of Systems:  GEN- denies fatigue, fever, weight loss,weakness, recent illness HEENT- denies eye drainage, change in vision, nasal discharge, CVS- denies chest pain, palpitations RESP- denies SOB, cough, wheeze ABD- denies N/V, change in stools, abd pain GU- denies dysuria, hematuria, dribbling, incontinence MSK- +joint pain, muscle aches, injury Neuro- denies headache, dizziness, syncope, seizure activity       Objective:    BP (!) 142/78   Pulse 88   Temp 97.8 F (36.6 C) (Temporal)   Resp 16   Ht 5\' 6"  (1.676 m)   Wt 274 lb (124.3 kg)   SpO2 95%   BMI 44.22 kg/m  GEN- NAD, alert and oriented x3 HEENT- PERRL, EOMI, non injected sclera, pink conjunctiva, MMM, oropharynx clear CVS- RRR, no murmur RESP-CTAB MSK- normal inspection,no effusion, mild TTP medial left knee, +crepitus/pop with ROM, ligaments grossly in tact EXT- No edema Pulses- Radial  2+        Assessment & Plan:      Problem List Items Addressed This Visit    None    Visit Diagnoses    Chronic pain of both knees    -  Primary   Referral to ortho, will defer imaging to them, given mobic for inflmmation, pt has knee sleeves to wear   Relevant Medications   meloxicam (MOBIC) 7.5 MG tablet   Primary osteoarthritis of left knee       Relevant Medications   meloxicam (MOBIC) 7.5 MG tablet   Need for  immunization against influenza       Relevant Orders   Flu Vaccine QUAD 36+ mos IM (Completed)      Note: This dictation was prepared with Dragon dictation along with smaller phrase technology. Any transcriptional errors that result from this process are unintentional.

## 2020-01-29 ENCOUNTER — Ambulatory Visit (INDEPENDENT_AMBULATORY_CARE_PROVIDER_SITE_OTHER): Payer: 59 | Admitting: Physician Assistant

## 2020-01-29 ENCOUNTER — Ambulatory Visit (INDEPENDENT_AMBULATORY_CARE_PROVIDER_SITE_OTHER): Payer: 59

## 2020-01-29 ENCOUNTER — Encounter: Payer: Self-pay | Admitting: Orthopedic Surgery

## 2020-01-29 ENCOUNTER — Ambulatory Visit: Payer: Self-pay

## 2020-01-29 DIAGNOSIS — M25562 Pain in left knee: Secondary | ICD-10-CM

## 2020-01-29 DIAGNOSIS — M25561 Pain in right knee: Secondary | ICD-10-CM | POA: Diagnosis not present

## 2020-01-29 NOTE — Progress Notes (Signed)
Office Visit Note   Patient: Toni Beard           Date of Birth: Jan 06, 1961           MRN: [id number] Visit Date: 01/29/2020              Requested by: Salley Scarlet, MD [address] [place],  [place] [address] PCP: Salley Scarlet, MD  Chief Complaint  Patient presents with  . Right Knee - Pain  . Left Knee - Pain      HPI: Patient is a pleasant 60 year old woman who comes in today complaining of bilateral knee pain and popping anteriorly.  She denies any injury.  She has been followed by her primary care provider who gave her meloxicam and she is now doing much better.  She really does not have much pain in her knees at this point.  She also is complaining of a small ulcer on the inside of her left ankle.  She has had this in the past and is actually has been much worse in the past.  She just noticed it has reoccurred.  She is not diabetic.  She wears VIVE compression socks but admits that she has not bought a new pair in 3 years and these have been washed several times and are worn out  Assessment & Plan: Visit Diagnoses:  1. Left knee pain, unspecified chronicity   2. Right knee pain, unspecified chronicity     Plan: We discussed her options.  Currently with the meloxicam she is fairly symptom-free.  I did show her some strengthening exercises for her quadriceps in the form of straight leg raises.  We could go forward with an injection today but she is fairly asymptomatic.  With regards to the small recurrence of the ulcer.  She does have significant varicosities.  I did tell her that it would probably be helpful for her to wear the socks and to obtain a new pair.  She will follow-up if the ulcer gets worse or if she wishes to go forward from injections  Follow-Up Instructions: No follow-ups on file.   Ortho Exam  Patient is alert, oriented, no adenopathy, well-dressed, normal affect, normal respiratory effort. On the inside of her left lower calf she has a  small half centimeter ulcer this does not probe deeply mild erythema surrounding it.  Nontender to palpation.  She has significant varicosities running down both her legs.  No other area of skin breakdown.  She has strong palpable distal pulses no cellulitis  Imaging: No results found. No images are attached to the encounter.  Labs: Lab Results  Component Value Date   HGBA1C 5.4 09/24/2019     Lab Results  Component Value Date   ALBUMIN 3.7 06/28/2016   ALBUMIN 3.7 04/02/2015   ALBUMIN 3.0 (L) 03/07/2013    No results found for: MG Lab Results  Component Value Date   VD25OH 34 04/02/2015    No results found for: PREALBUMIN CBC EXTENDED Latest Ref Rng & Units 09/24/2019 09/20/2018 12/26/2017  WBC 3.8 - 10.8 Thousand/uL 9.5 8.9 8.1  RBC 3.80 - 5.10 Million/uL 4.66 4.45 4.71  HGB 11.7 - 15.5 g/dL 56.4 33.2 95.1  HCT 35 - 45 % 43.0 40.1 42.0  PLT 140 - 400 Thousand/uL 283 299 296  NEUTROABS 1,500 - 7,800 cells/uL 5,938 5,776 4,763  LYMPHSABS 850 - 3,900 cells/uL 2,727 2,225 2,633     There is no height or weight  on file to calculate BMI.  Orders:  Orders Placed This Encounter  Procedures  . XR KNEE 3 VIEW RIGHT  . XR KNEE 3 VIEW LEFT   No orders of the defined types were placed in this encounter.    Procedures: No procedures performed  Clinical Data: No additional findings.  ROS:  All other systems negative, except as noted in the HPI. Review of Systems  Objective: Vital Signs: There were no vitals taken for this visit.  Specialty Comments:  No specialty comments available.  PMFS History: Patient Active Problem List   Diagnosis Date Noted  . Varicose veins of both lower extremities 06/28/2016  . Tinnitus 04/02/2015  . Class 3 obesity 05/17/2014  . Seasonal allergies   . Carcinoma in situ of cervix uteri 02/06/2013  . Hypertension 01/12/2013   Past Medical History:  Diagnosis Date  . Allergy    seasonal  . Contraceptive management 01/12/2013  .  Hypertension   . Obesity     Family History  Problem Relation Age of Onset  . Diabetes Mother   . Hypertension Mother   . Heart disease Mother   . Arthritis Mother   . Hyperlipidemia Mother   . Hypertension Sister   . Cancer Sister        kidney,lung  . Depression Sister   . Miscarriages / Stillbirths Sister   . Hypertension Brother   . Cancer Brother        prostate  . Heart disease Brother   . Vision loss Maternal Grandfather   . Early death Father   . Hearing loss Brother   . Learning disabilities Brother     Past Surgical History:  Procedure Laterality Date  . CERVICAL CONIZATION W/BX N/A 03/14/2013   Procedure: CONIZATION CERVIX ;  Surgeon: Lazaro Arms, MD;  Location: [facility];  Service: Gynecology;  Laterality: N/A;  . HOLMIUM LASER APPLICATION N/A 03/14/2013   Procedure: HOLMIUM LASER APPLICATION;  Surgeon: Lazaro Arms, MD;  Location: [facility];  Service: Gynecology;  Laterality: N/A;   Social History   Occupational History  . Not on file  Tobacco Use  . Smoking status: Never Smoker  . Smokeless tobacco: Never Used  Vaping Use  . Vaping Use: Never used  Substance and Sexual Activity  . Alcohol use: Yes    Alcohol/week: 0.0 standard drinks    Comment: occasionally   . Drug use: No  . Sexual activity: Yes    Birth control/protection: Condom

## 2020-05-27 ENCOUNTER — Ambulatory Visit
Admission: EM | Admit: 2020-05-27 | Discharge: 2020-05-27 | Disposition: A | Discharge status: Home / Self Care | Payer: 59 | Attending: Internal Medicine | Admitting: Internal Medicine

## 2020-05-27 ENCOUNTER — Other Ambulatory Visit: Payer: Self-pay

## 2020-05-27 ENCOUNTER — Encounter: Payer: Self-pay | Admitting: Emergency Medicine

## 2020-05-27 DIAGNOSIS — J039 Acute tonsillitis, unspecified: Secondary | ICD-10-CM

## 2020-05-27 LAB — POCT RAPID STREP A (OFFICE): Rapid Strep A Screen: NEGATIVE

## 2020-05-27 MED ORDER — PENICILLIN V POTASSIUM 500 MG PO TABS: 500.0000 mg | ORAL_TABLET | Freq: Two times a day (BID) | ORAL | 0 refills | Status: AC

## 2020-05-27 NOTE — ED Triage Notes (Signed)
RT ear pain , RT throat pain, headache and nasal congestion that started today.  Pt reports she had chills yesterday

## 2020-05-27 NOTE — Discharge Instructions (Signed)
We are sending the throat culture out since the rapid one is negative, but in the mean time I will place you on an antibiotic since your tonsils look infected.

## 2020-05-27 NOTE — ED Provider Notes (Signed)
[facility]    CSN: [id number] Arrival date & time: 05/27/20  1518      History   Chief Complaint Chief Complaint  Patient presents with  . Otalgia    HPI Toni Beard is a 60 y.o. female R ear pain since today. Has been having ST which started when when she woke up 2 days ago, and is still present,  later that night got chills. Yesterday felt hot and cold and mild aches. Every time she swallows her R throat hurts her.   Past Medical History:  Diagnosis Date  . Allergy    seasonal  . Contraceptive management 01/12/2013  . Hypertension   . Obesity     Patient Active Problem List   Diagnosis Date Noted  . Varicose veins of both lower extremities 06/28/2016  . Tinnitus 04/02/2015  . Class 3 obesity 05/17/2014  . Seasonal allergies   . Carcinoma in situ of cervix uteri 02/06/2013  . Hypertension 01/12/2013    Past Surgical History:  Procedure Laterality Date  . CERVICAL CONIZATION W/BX N/A 03/14/2013   Procedure: CONIZATION CERVIX ;  Surgeon: Lazaro Arms, MD;  Location: [facility];  Service: Gynecology;  Laterality: N/A;  . HOLMIUM LASER APPLICATION N/A 03/14/2013   Procedure: HOLMIUM LASER APPLICATION;  Surgeon: Lazaro Arms, MD;  Location: [facility];  Service: Gynecology;  Laterality: N/A;    OB History    Gravida  1   Para  1   Term      Preterm      AB      Living  1     SAB      IAB      Ectopic      Multiple      Live Births  1            Home Medications    Prior to Admission medications   Medication Sig Start Date End Date Taking? Authorizing Provider  penicillin v potassium (VEETID) 500 MG tablet Take 1 tablet (500 mg total) by mouth in the morning and at bedtime for 10 days. 05/27/20 06/06/20 Yes Rodriguez-Southworth, Nettie Elm, PA-C  b complex vitamins tablet Take 1 tablet by mouth daily.    [provider]  calcium carbonate (OS-CAL) 600 MG TABS tablet Take 600 mg by mouth daily with breakfast.    [provider]  cetirizine (ZYRTEC) 10 MG tablet Take 1 tablet (10 mg total) by mouth at bedtime. 05/17/14   Salley Scarlet, MD  hydrochlorothiazide (MICROZIDE) 12.5 MG capsule Take 1 capsule by mouth once daily 10/10/19   Salley Scarlet, MD  meloxicam (MOBIC) 7.5 MG tablet Take 1 tablet (7.5 mg total) by mouth daily. 01/15/20   Salley Scarlet, MD    Family History Family History  Problem Relation Age of Onset  . Diabetes Mother   . Hypertension Mother   . Heart disease Mother   . Arthritis Mother   . Hyperlipidemia Mother   . Hypertension Sister   . Cancer Sister        kidney,lung  . Depression Sister   . Miscarriages / Stillbirths Sister   . Hypertension Brother   . Cancer Brother        prostate  . Heart disease Brother   . Vision loss Maternal Grandfather   . Early death Father   . Hearing loss Brother   . Learning disabilities Brother     Social History Social History  Tobacco Use  . Smoking status: Never Smoker  . Smokeless tobacco: Never Used  Vaping Use  . Vaping Use: Never used  Substance Use Topics  . Alcohol use: Yes    Alcohol/week: 0.0 standard drinks    Comment: occasionally   . Drug use: No     Allergies   Sulfa antibiotics   Review of Systems Review of Systems  Constitutional: Positive for chills. Negative for activity change, appetite change and fever.  HENT: Positive for congestion, ear pain, sinus pain and sore throat. Negative for ear discharge and trouble swallowing.   Eyes: Negative for discharge.  Respiratory: Positive for cough. Negative for chest tightness and shortness of breath.   Cardiovascular: Negative for chest pain.  Musculoskeletal: Positive for myalgias.  Skin: Negative for rash.  Neurological: Negative for headaches.     Physical Exam Triage Vital Signs ED Triage Vitals  Enc Vitals Group     BP 05/27/20 1529 130/83     Pulse Rate 05/27/20 1529 (!) 113     Resp 05/27/20 1529 20     Temp 05/27/20 1529 98.4  F (36.9 C)     Temp Source 05/27/20 1529 Oral     SpO2 05/27/20 1529 95 %     Weight --      Height --      Head Circumference --      Peak Flow --      Pain Score 05/27/20 1528 6     Pain Loc --      Pain Edu? --      Excl. in GC? --    No data found.  Updated Vital Signs BP 130/83 (BP Location: Right Arm)   Pulse (!) 113   Temp 98.4 F (36.9 C) (Oral)   Resp 20   SpO2 95%   Visual Acuity Right Eye Distance:   Left Eye Distance:   Bilateral Distance:    Right Eye Near:   Left Eye Near:    Bilateral Near:     Physical Exam Vitals and nursing note reviewed.  Constitutional:      General: She is not in acute distress.    Appearance: She is obese. She is not toxic-appearing.  HENT:     Head: Normocephalic.     Right Ear: Tympanic membrane, ear canal and external ear normal.     Left Ear: Tympanic membrane, ear canal and external ear normal.     Nose: Nose normal.     Mouth/Throat:     Mouth: Mucous membranes are moist.     Pharynx: Oropharyngeal exudate and posterior oropharyngeal erythema present.  Eyes:     General: No scleral icterus.    Conjunctiva/sclera: Conjunctivae normal.  Cardiovascular:     Rate and Rhythm: Tachycardia present.  Pulmonary:     Effort: Pulmonary effort is normal.     Breath sounds: Normal breath sounds. No wheezing.  Musculoskeletal:        General: Normal range of motion.     Cervical back: Neck supple.  Lymphadenopathy:     Cervical: Cervical adenopathy present.  Skin:    General: Skin is warm and dry.     Findings: No rash.  Neurological:     Mental Status: She is alert and oriented to person, place, and time.     Gait: Gait normal.  Psychiatric:        Mood and Affect: Mood normal.        Behavior: Behavior normal.  Thought Content: Thought content normal.        Judgment: Judgment normal.      UC Treatments / Results  Labs (all labs ordered are listed, but only abnormal results are displayed) Labs Reviewed   CULTURE, GROUP A STREP [facility])  POCT RAPID STREP A (OFFICE)    EKG   Radiology No results found.  Procedures Procedures (including critical care time)  Medications Ordered in UC Medications - No data to display  Initial Impression / Assessment and Plan / UC Course  I have reviewed the triage vital signs and the nursing notes. Pertinent labs results that were available during my care of the patient were reviewed by me and considered in my medical decision making (see chart for details). Has exudative tonsillitis. Throat culture was sent.  I placed her on Penicillin as noted.  Final Clinical Impressions(s) / UC Diagnoses   Final diagnoses:  Exudative tonsillitis     Discharge Instructions     We are sending the throat culture out since the rapid one is negative, but in the mean time I will place you on an antibiotic since your tonsils look infected.     ED Prescriptions    Medication Sig Dispense Auth. Provider   penicillin v potassium (VEETID) 500 MG tablet Take 1 tablet (500 mg total) by mouth in the morning and at bedtime for 10 days. 20 tablet Rodriguez-Southworth, Nettie Elm, PA-C     PDMP not reviewed this encounter.   Garey Ham, Cordelia Poche 05/27/20 1555

## 2020-05-28 LAB — CULTURE, GROUP A STREP (THRC)

## 2020-05-29 ENCOUNTER — Telehealth: Payer: Self-pay | Admitting: Emergency Medicine

## 2020-05-29 NOTE — Telephone Encounter (Signed)
Pt called to get her results from strep test.  RN explained that she did have strep but the PCN they started her on would cover the infection.  Pt verbalized understanding.

## 2020-07-02 ENCOUNTER — Ambulatory Visit
Admission: EM | Admit: 2020-07-02 | Discharge: 2020-07-02 | Disposition: A | Discharge status: Home / Self Care | Payer: 59 | Attending: Family Medicine | Admitting: Family Medicine

## 2020-07-02 ENCOUNTER — Encounter: Payer: Self-pay | Admitting: Emergency Medicine

## 2020-07-02 ENCOUNTER — Other Ambulatory Visit: Payer: Self-pay

## 2020-07-02 DIAGNOSIS — R300 Dysuria: Secondary | ICD-10-CM | POA: Diagnosis present

## 2020-07-02 LAB — POCT URINALYSIS DIP (MANUAL ENTRY)
Bilirubin, UA: NEGATIVE
Glucose, UA: NEGATIVE mg/dL
Ketones, POC UA: NEGATIVE mg/dL
Leukocytes, UA: NEGATIVE
Nitrite, UA: NEGATIVE
Protein Ur, POC: NEGATIVE mg/dL
Spec Grav, UA: 1.02 (ref 1.010–1.025)
Urobilinogen, UA: 0.2 E.U./dL
pH, UA: 7 (ref 5.0–8.0)

## 2020-07-02 NOTE — ED Provider Notes (Signed)
[facility]    CSN: [id number] Arrival date & time: 07/02/20  2423      History   Chief Complaint No chief complaint on file.   HPI Toni Beard is a 60 y.o. female.   Mild dysuria intermittently for the last week.  States that she has her urine checked at weight loss meeting, and they stated that it looked like she was beginning with a UTI.  States that when she was there that day, her urine was more cloudy than it usually was.  Denies consistent dysuria, frequency, urgency, chills, fever, abdominal pain, flank pain,  The history is provided by the patient.    Past Medical History:  Diagnosis Date  . Allergy    seasonal  . Contraceptive management 01/12/2013  . Hypertension   . Obesity     Patient Active Problem List   Diagnosis Date Noted  . Varicose veins of both lower extremities 06/28/2016  . Tinnitus 04/02/2015  . Class 3 obesity 05/17/2014  . Seasonal allergies   . Carcinoma in situ of cervix uteri 02/06/2013  . Hypertension 01/12/2013    Past Surgical History:  Procedure Laterality Date  . CERVICAL CONIZATION W/BX N/A 03/14/2013   Procedure: CONIZATION CERVIX ;  Surgeon: Lazaro Arms, MD;  Location: [facility];  Service: Gynecology;  Laterality: N/A;  . HOLMIUM LASER APPLICATION N/A 03/14/2013   Procedure: HOLMIUM LASER APPLICATION;  Surgeon: Lazaro Arms, MD;  Location: [facility];  Service: Gynecology;  Laterality: N/A;    OB History    Gravida  1   Para  1   Term      Preterm      AB      Living  1     SAB      IAB      Ectopic      Multiple      Live Births  1            Home Medications    Prior to Admission medications   Medication Sig Start Date End Date Taking? Authorizing Provider  b complex vitamins tablet Take 1 tablet by mouth daily.    [provider]  calcium carbonate (OS-CAL) 600 MG TABS tablet Take 600 mg by mouth daily with breakfast.    [provider]  cetirizine (ZYRTEC) 10 MG  tablet Take 1 tablet (10 mg total) by mouth at bedtime. 05/17/14   Salley Scarlet, MD  hydrochlorothiazide (MICROZIDE) 12.5 MG capsule Take 1 capsule by mouth once daily 10/10/19   Salley Scarlet, MD  meloxicam (MOBIC) 7.5 MG tablet Take 1 tablet (7.5 mg total) by mouth daily. 01/15/20   Salley Scarlet, MD    Family History Family History  Problem Relation Age of Onset  . Diabetes Mother   . Hypertension Mother   . Heart disease Mother   . Arthritis Mother   . Hyperlipidemia Mother   . Hypertension Sister   . Cancer Sister        kidney,lung  . Depression Sister   . Miscarriages / Stillbirths Sister   . Hypertension Brother   . Cancer Brother        prostate  . Heart disease Brother   . Vision loss Maternal Grandfather   . Early death Father   . Hearing loss Brother   . Learning disabilities Brother     Social History Social History   Tobacco Use  . Smoking status: Never Smoker  .  Smokeless tobacco: Never Used  Vaping Use  . Vaping Use: Never used  Substance Use Topics  . Alcohol use: Yes    Alcohol/week: 0.0 standard drinks    Comment: occasionally   . Drug use: No     Allergies   Sulfa antibiotics   Review of Systems Review of Systems   Physical Exam Triage Vital Signs ED Triage Vitals  Enc Vitals Group     BP 07/02/20 0922 (!) 147/75     Pulse Rate 07/02/20 0922 76     Resp 07/02/20 0922 18     Temp 07/02/20 0922 97.7 F (36.5 C)     Temp src --      SpO2 07/02/20 0922 97 %     Weight --      Height --      Head Circumference --      Peak Flow --      Pain Score 07/02/20 0926 0     Pain Loc --      Pain Edu? --      Excl. in GC? --    No data found.  Updated Vital Signs BP (!) 147/75   Pulse 76   Temp 97.7 F (36.5 C)   Resp 18   SpO2 97%   Visual Acuity Right Eye Distance:   Left Eye Distance:   Bilateral Distance:    Right Eye Near:   Left Eye Near:    Bilateral Near:     Physical Exam Vitals and nursing note  reviewed.  Constitutional:      General: She is not in acute distress.    Appearance: She is well-developed. She is obese. She is not ill-appearing.  HENT:     Head: Normocephalic and atraumatic.  Eyes:     Extraocular Movements: Extraocular movements intact.     Conjunctiva/sclera: Conjunctivae normal.     Pupils: Pupils are equal, round, and reactive to light.  Cardiovascular:     Rate and Rhythm: Normal rate and regular rhythm.  Pulmonary:     Effort: Pulmonary effort is normal.  Abdominal:     General: There is no distension.     Palpations: Abdomen is soft. There is no mass.     Tenderness: There is no abdominal tenderness. There is no right CVA tenderness, left CVA tenderness, guarding or rebound.     Hernia: No hernia is present.  Musculoskeletal:        General: Normal range of motion.     Cervical back: Normal range of motion and neck supple.  Skin:    General: Skin is warm and dry.  Neurological:     General: No focal deficit present.     Mental Status: She is alert and oriented to person, place, and time.  Psychiatric:        Mood and Affect: Mood normal.        Behavior: Behavior normal.        Thought Content: Thought content normal.      UC Treatments / Results  Labs (all labs ordered are listed, but only abnormal results are displayed) Labs Reviewed  POCT URINALYSIS DIP (MANUAL ENTRY) - Abnormal; Notable for the following components:      Result Value   Blood, UA trace-intact (*)    All other components within normal limits  URINE CULTURE    EKG   Radiology No results found.  Procedures Procedures (including critical care time)  Medications Ordered in UC Medications - No  data to display  Initial Impression / Assessment and Plan / UC Course  I have reviewed the triage vital signs and the nursing notes.  Pertinent labs & imaging results that were available during my care of the patient were reviewed by me and considered in my medical decision  making (see chart for details).    Dysuria  UA not showing any infection in the office today We will culture for confirmation given intermittent symptoms Drink plenty of fluids May take Azo as needed over-the-counter We will follow-up with any abnormal labs that require further treatment Follow-up with PCP or with this office as needed  Final Clinical Impressions(s) / UC Diagnoses   Final diagnoses:  Dysuria     Discharge Instructions     You may have a urinary tract infection.   We are going to culture your urine and will call you as soon as we have the results.   Drink plenty of water, 8-10 glasses per day.   You may take AZO over the counter for painful urination.  Follow up with your primary care provider as needed.   Go to the Emergency Department if you experience severe pain, shortness of breath, high fever, or other concerns.      ED Prescriptions    None     PDMP not reviewed this encounter.   Moshe Cipro, NP 07/02/20 1002

## 2020-07-02 NOTE — Discharge Instructions (Signed)
You may have a urinary tract infection. We are going to culture your urine and will call you as soon as we have the results.   Drink plenty of water, 8-10 glasses per day.   You may take AZO over the counter for painful urination.  Follow up with your primary care provider as needed.   Go to the Emergency Department if you experience severe pain, shortness of breath, high fever, or other concerns.   

## 2020-07-02 NOTE — ED Triage Notes (Signed)
burning on urination x 2 days.

## 2020-07-04 LAB — URINE CULTURE

## 2020-09-01 ENCOUNTER — Ambulatory Visit
Admission: EM | Admit: 2020-09-01 | Discharge: 2020-09-01 | Disposition: A | Discharge status: Home / Self Care | Payer: 59 | Attending: Family Medicine | Admitting: Family Medicine

## 2020-09-01 ENCOUNTER — Encounter: Payer: Self-pay | Admitting: Emergency Medicine

## 2020-09-01 DIAGNOSIS — U071 COVID-19: Secondary | ICD-10-CM

## 2020-09-01 MED ORDER — PROMETHAZINE-DM 6.25-15 MG/5ML PO SYRP: 5.0000 mL | ORAL_SOLUTION | Freq: Four times a day (QID) | ORAL | 0 refills | Status: DC | PRN

## 2020-09-01 MED ORDER — NIRMATRELVIR/RITONAVIR (PAXLOVID)TABLET: 3.0000 | ORAL_TABLET | Freq: Two times a day (BID) | ORAL | 0 refills | Status: AC

## 2020-09-01 NOTE — ED Triage Notes (Signed)
Sore throat, fever, nasal congestion since Saturday.  Took home covid test yesterday that was positive.

## 2020-09-01 NOTE — ED Provider Notes (Signed)
[facility]    CSN: [id number] Arrival date & time: 09/01/20  1041      History   Chief Complaint No chief complaint on file.   HPI Toni Beard is a 60 y.o. female.   HPI Patient presents today for evaluation after positive testing for COVID-19 virus at home.  Her current symptoms began 3 days ago and include cough, nasal congestion, runny nose, low-grade fever.  Patient tested positive for COVID x1 day ago.  High risk conditions associated with COVID hypertension and obesity.  She has had a known close exposure. Past Medical History:  Diagnosis Date   Allergy    seasonal   Contraceptive management 01/12/2013   Hypertension    Obesity     Patient Active Problem List   Diagnosis Date Noted   Varicose veins of both lower extremities 06/28/2016   Tinnitus 04/02/2015   Class 3 obesity 05/17/2014   Seasonal allergies    Carcinoma in situ of cervix uteri 02/06/2013   Hypertension 01/12/2013    Past Surgical History:  Procedure Laterality Date   CERVICAL CONIZATION W/BX N/A 03/14/2013   Procedure: CONIZATION CERVIX ;  Surgeon: Lazaro Arms, MD;  Location: [facility];  Service: Gynecology;  Laterality: N/A;   HOLMIUM LASER APPLICATION N/A 03/14/2013   Procedure: HOLMIUM LASER APPLICATION;  Surgeon: Lazaro Arms, MD;  Location: [facility];  Service: Gynecology;  Laterality: N/A;    OB History     Gravida  1   Para  1   Term      Preterm      AB      Living  1      SAB      IAB      Ectopic      Multiple      Live Births  1            Home Medications    Prior to Admission medications   Medication Sig Start Date End Date Taking? Authorizing Provider  b complex vitamins tablet Take 1 tablet by mouth daily.    [provider]  calcium carbonate (OS-CAL) 600 MG TABS tablet Take 600 mg by mouth daily with breakfast.    [provider]  cetirizine (ZYRTEC) 10 MG tablet Take 1 tablet (10 mg total) by mouth at bedtime.  05/17/14   Salley Scarlet, MD  hydrochlorothiazide (MICROZIDE) 12.5 MG capsule Take 1 capsule by mouth once daily 10/10/19   Salley Scarlet, MD  meloxicam (MOBIC) 7.5 MG tablet Take 1 tablet (7.5 mg total) by mouth daily. 01/15/20   Salley Scarlet, MD    Family History Family History  Problem Relation Age of Onset   Diabetes Mother    Hypertension Mother    Heart disease Mother    Arthritis Mother    Hyperlipidemia Mother    Hypertension Sister    Cancer Sister        kidney,lung   Depression Sister    Miscarriages / Stillbirths Sister    Hypertension Brother    Cancer Brother        prostate   Heart disease Brother    Vision loss Maternal Grandfather    Early death Father    Hearing loss Brother    Learning disabilities Brother     Social History Social History   Tobacco Use   Smoking status: Never   Smokeless tobacco: Never  Vaping Use   Vaping Use: Never used  Substance Use Topics   Alcohol use: Yes    Alcohol/week: 0.0 standard drinks    Comment: occasionally    Drug use: No     Allergies   Sulfa antibiotics   Review of Systems Review of Systems Pertinent negatives listed in HPI   Physical Exam Triage Vital Signs ED Triage Vitals  Enc Vitals Group     BP 09/01/20 1142 127/87     Pulse Rate 09/01/20 1142 95     Resp 09/01/20 1142 18     Temp 09/01/20 1142 97.6 F (36.4 C)     Temp Source 09/01/20 1142 Temporal     SpO2 09/01/20 1142 95 %     Weight --      Height --      Head Circumference --      Peak Flow --      Pain Score 09/01/20 1143 0     Pain Loc --      Pain Edu? --      Excl. in GC? --    No data found.  Updated Vital Signs BP 127/87 (BP Location: Right Arm)   Pulse 95   Temp 97.6 F (36.4 C) (Temporal)   Resp 18   SpO2 95%   Visual Acuity Right Eye Distance:   Left Eye Distance:   Bilateral Distance:    Right Eye Near:   Left Eye Near:    Bilateral Near:     Physical Exam  General Appearance:    Alert,  cooperative, no distress  HENT:   Normocephalic, ears normal, nares mucosal edema with congestion, rhinorrhea, oropharynx clear  Eyes:    PERRL, conjunctiva/corneas clear, EOM's intact       Lungs:     Clear to auscultation bilaterally, respirations unlabored  Heart:    Regular rate and rhythm  Neurologic:   Awake, alert, oriented x 3. No apparent focal neurological  defect.      UC Treatments / Results  Labs (all labs ordered are listed, but only abnormal results are displayed) Labs Reviewed - No data to display  EKG   Radiology No results found.  Procedures Procedures (including critical care time)  Medications Ordered in UC Medications - No data to display  Initial Impression / Assessment and Plan / UC Course  I have reviewed the triage vital signs and the nursing notes.  Pertinent labs & imaging results that were available during my care of the patient were reviewed by me and considered in my medical decision making (see chart for details).     COVID-19 virus with upper respiratory symptoms.  Continue home management of symptoms.  Continue Tylenol or ibuprofen as needed for fever and/or body aches if they develop.  Promethazine DM for cough and nasal congestion.  Given patient's high risk conditions she qualifies for pack Slo-Bid.  Most recent GFR was 11 months ago when patient's GFR was greater than 60.  Will prescribe regular dose of pack Paxlovid.  Return precautions given if symptoms worsen or do not improve. Final Clinical Impressions(s) / UC Diagnoses   Final diagnoses:  COVID-19 virus infection   Discharge Instructions   None    ED Prescriptions     Medication Sig Dispense Auth. Provider   promethazine-dextromethorphan (PROMETHAZINE-DM) 6.25-15 MG/5ML syrup Take 5 mLs by mouth 4 (four) times daily as needed for cough. 140 mL Bing Neighbors, FNP   nirmatrelvir/ritonavir EUA (PAXLOVID) TABS Take 3 tablets by mouth 2 (two) times daily for 5 days.  Patient GFR  is 73. Take nirmatrelvir (150 mg) two tablets twice daily for 5 days and ritonavir (100 mg) one tablet twice daily for 5 days. 30 tablet Bing Neighbors, FNP      PDMP not reviewed this encounter.   Bing Neighbors, FNP 09/01/20 1257

## 2020-09-08 ENCOUNTER — Telehealth (INDEPENDENT_AMBULATORY_CARE_PROVIDER_SITE_OTHER): Payer: Self-pay

## 2020-09-08 NOTE — Telephone Encounter (Signed)
Patient called and stated that she was recommended to see Dr. Karilyn Cota and wanted to know if we can accept her as a new patient as Dr. Jeanice Lim is no longer going to practice general care. Please advise if OK to accept patient.

## 2020-09-08 NOTE — Telephone Encounter (Signed)
I will accept this patient.  Please make her new patient appointment with me.  Thanks. 

## 2020-09-10 NOTE — Telephone Encounter (Signed)
Called patient and scheduled her a new appointment on 10/13/2020 at 5pm. Patient verbalized an understanding.

## 2020-10-13 ENCOUNTER — Ambulatory Visit (INDEPENDENT_AMBULATORY_CARE_PROVIDER_SITE_OTHER): Payer: 59 | Admitting: Internal Medicine

## 2021-05-01 ENCOUNTER — Other Ambulatory Visit: Payer: Self-pay | Admitting: Adult Health

## 2021-05-04 ENCOUNTER — Encounter: Payer: Self-pay | Admitting: Adult Health

## 2021-05-04 ENCOUNTER — Other Ambulatory Visit: Payer: Self-pay

## 2021-05-04 ENCOUNTER — Ambulatory Visit (INDEPENDENT_AMBULATORY_CARE_PROVIDER_SITE_OTHER): Payer: 59 | Admitting: Adult Health

## 2021-05-04 ENCOUNTER — Other Ambulatory Visit (HOSPITAL_COMMUNITY)
Admission: RE | Admit: 2021-05-04 | Discharge: 2021-05-04 | Disposition: A | Discharge status: Home / Self Care | Payer: 59 | Source: Ambulatory Visit | Attending: Adult Health | Admitting: Adult Health

## 2021-05-04 VITALS — BP 162/100 | HR 89 | Ht 67.0 in | Wt 272.0 lb

## 2021-05-04 DIAGNOSIS — N907 Vulvar cyst: Secondary | ICD-10-CM

## 2021-05-04 DIAGNOSIS — Z01419 Encounter for gynecological examination (general) (routine) without abnormal findings: Secondary | ICD-10-CM | POA: Diagnosis present

## 2021-05-04 DIAGNOSIS — Z780 Asymptomatic menopausal state: Secondary | ICD-10-CM

## 2021-05-04 DIAGNOSIS — Z0001 Encounter for general adult medical examination with abnormal findings: Secondary | ICD-10-CM | POA: Insufficient documentation

## 2021-05-04 DIAGNOSIS — Z1211 Encounter for screening for malignant neoplasm of colon: Secondary | ICD-10-CM

## 2021-05-04 DIAGNOSIS — I10 Essential (primary) hypertension: Secondary | ICD-10-CM

## 2021-05-04 LAB — HEMOCCULT GUIAC POC 1CARD (OFFICE): Fecal Occult Blood, POC: NEGATIVE

## 2021-05-04 MED ORDER — HYDROCHLOROTHIAZIDE 12.5 MG PO CAPS
12.5000 mg | ORAL_CAPSULE | Freq: Every day | ORAL | 3 refills | Status: DC
Start: 2021-05-04 — End: 2022-05-24

## 2021-05-04 NOTE — Progress Notes (Signed)
Patient ID: Toni Beard, female   DOB: [birth year], 61 y.o.   MRN: [id number] ?History of Present Illness: ?Toni Beard is a 61 year old white female, divorced, PM in for a well woman gyn exam and pap.  ?No current PCP ? ? ?Current Medications, Allergies, Past Medical History, Past Surgical History, Family History and Social History were reviewed in [employer] record.   ? ? ?Review of Systems: ?Patient denies any headaches, hearing loss, fatigue, blurred vision, shortness of breath, chest pain, abdominal pain, problems with bowel movements, urination, or intercourse. No joint pain or mood swings.  ? ? ? ?Physical Exam:BP (!) 162/100 (BP Location: Left Arm, Patient Position: Sitting, Cuff Size: Normal)   Pulse 89   Ht 5\' 7"  (1.702 m)   Wt 272 lb (123.4 kg)   BMI 42.60 kg/m?   ?General:  Well developed, well nourished, no acute distress ?Skin:  Warm and dry ?Neck:  Midline trachea, normal thyroid, good ROM, no lymphadenopathy ?Lungs; Clear to auscultation bilaterally ?Breast:  No dominant palpable mass, retraction, or nipple discharge ?Cardiovascular: Regular rate and rhythm ?Abdomen:  Soft, non tender, no hepatosplenomegaly ?Pelvic:  External genitalia is normal in appearance, no multiple sebaceous cysts both labia, R>L.  The vagina is normal in appearance. Urethra has no lesions or masses. The cervix is smooth, pap with HR HPV genotyping performed.  Uterus is felt to be normal size, shape, and contour.  No adnexal masses or tenderness noted.Bladder is non tender, no masses felt. ?Rectal: Good sphincter tone, no polyps, or hemorrhoids felt.  Hemoccult negative. ?Extremities/musculoskeletal:  No swelling or varicosities noted, no clubbing or cyanosis ?Psych:  No mood changes, alert and cooperative,seems happy ?AA is 0 ?Fall risk is low ?Depression screen [facility] 2/9 05/04/2021 09/24/2019 09/20/2018  ?Decreased Interest 0 0 0  ?Down, Depressed, Hopeless 0 0 0  ?PHQ - 2 Score 0 0 0  ?Altered sleeping 1 -  -  ?Tired, decreased energy 1 - -  ?Change in appetite 2 - -  ?Feeling bad or failure about yourself  0 - -  ?Trouble concentrating 0 - -  ?Moving slowly or fidgety/restless 0 - -  ?Suicidal thoughts 0 - -  ?PHQ-9 Score 4 - -  ?Difficult doing work/chores - - -  ?  ?GAD 7 : Generalized Anxiety Score 05/04/2021  ?Nervous, Anxious, on Edge 0  ?Control/stop worrying 0  ?Worry too much - different things 0  ?Trouble relaxing 0  ?Restless 0  ?Easily annoyed or irritable 0  ?Afraid - awful might happen 0  ?Total GAD 7 Score 0  ? ? [place] - 05/04/21 1344   ? ?  ? Pregnancy Intention Screening  ? Does the patient want to become pregnant in the next year? No   ? Does the patient's partner want to become pregnant in the next year? No   ? Would the patient like to discuss contraceptive options today? No   ?  ? Contraception Wrap Up  ? Current Method No Method - Other Reason   postmenopausal; condom  ? End Method No Method - Other Reason   postmenopausal; condom  ? Contraception Counseling Provided No   ? ?  ?  ? ?  ? Examination chaperoned by 05/06/21 LPN ? ?  ? ?Impression and Plan: ?1. Encounter for gynecological examination with Papanicolaou smear of cervix ?Pap sent ?Physical in 1 year ?Pap in 3 if normal ?Labs with PCP ?Colonoscopy per GI ?Mammogram yearly  ? ?2.  Encounter for screening fecal occult blood testing ? ? ?3. Primary hypertension ?Will refill Microzide, til get PCP ?Meds ordered this encounter  ?Medications  ? hydrochlorothiazide (MICROZIDE) 12.5 MG capsule  ?  Sig: Take 1 capsule (12.5 mg total) by mouth daily.  ?  Dispense:  90 capsule  ?  Refill:  3  ?  Order Specific Question:   Supervising Provider  ?  Answer:   Duane Lope H [[id number]]  ?  ? ?4. Sebaceous cyst of labia ?Just leave alone ? ?5. Postmenopause ? ? ? ? ?  ?  ?

## 2021-05-08 LAB — CYTOLOGY - PAP
Comment: NEGATIVE
Diagnosis: NEGATIVE
High risk HPV: NEGATIVE

## 2021-07-28 ENCOUNTER — Encounter: Payer: Self-pay | Admitting: Emergency Medicine

## 2021-07-28 ENCOUNTER — Ambulatory Visit
Admission: EM | Admit: 2021-07-28 | Discharge: 2021-07-28 | Disposition: A | Discharge status: Home / Self Care | Payer: 59 | Attending: Nurse Practitioner | Admitting: Nurse Practitioner

## 2021-07-28 DIAGNOSIS — J309 Allergic rhinitis, unspecified: Secondary | ICD-10-CM

## 2021-07-28 DIAGNOSIS — J029 Acute pharyngitis, unspecified: Secondary | ICD-10-CM

## 2021-07-28 LAB — POCT RAPID STREP A (OFFICE): Rapid Strep A Screen: NEGATIVE

## 2021-07-28 MED ORDER — BENZONATATE 100 MG PO CAPS: 100.0000 mg | ORAL_CAPSULE | Freq: Three times a day (TID) | ORAL | 0 refills | Status: AC | PRN

## 2021-07-28 MED ORDER — CETIRIZINE-PSEUDOEPHEDRINE ER 5-120 MG PO TB12: 1.0000 | ORAL_TABLET | Freq: Two times a day (BID) | ORAL | 0 refills | Status: DC

## 2021-07-28 NOTE — ED Provider Notes (Signed)
[facility]    CSN: [id number] Arrival date & time: 07/28/21  0807      History   Chief Complaint No chief complaint on file.   HPI Toni Beard is a 61 y.o. female.   The history is provided by the patient.   Patient presents for complaints of sore throat, hoarseness, and cough.  Symptoms have been present for 3 days.  She denies fever, chills, wheezing, shortness of breath, or GI symptoms.  She also complains of right ear pain.  Patient states she has a history of a middle ear effusion and was told by her physician that she needs to take an allergy medication daily.  She has been taking over-the-counter medication for her symptoms. Past Medical History:  Diagnosis Date   Allergy    seasonal   Contraceptive management 01/12/2013   Hypertension    Obesity     Patient Active Problem List   Diagnosis Date Noted   Sebaceous cyst of labia 05/04/2021   Encounter for screening fecal occult blood testing 05/04/2021   Encounter for gynecological examination with Papanicolaou smear of cervix 05/04/2021   Postmenopause 05/04/2021   Varicose veins of both lower extremities 06/28/2016   Tinnitus 04/02/2015   Class 3 obesity (HCC) 05/17/2014   Seasonal allergies    Carcinoma in situ of cervix uteri 02/06/2013   Hypertension 01/12/2013    Past Surgical History:  Procedure Laterality Date   CERVICAL CONIZATION W/BX N/A 03/14/2013   Procedure: CONIZATION CERVIX ;  Surgeon: Lazaro ArmsLuther H Eure, MD;  Location: [facility];  Service: Gynecology;  Laterality: N/A;   HOLMIUM LASER APPLICATION N/A 03/14/2013   Procedure: HOLMIUM LASER APPLICATION;  Surgeon: Lazaro ArmsLuther H Eure, MD;  Location: [facility];  Service: Gynecology;  Laterality: N/A;    OB History     Gravida  1   Para  1   Term      Preterm      AB      Living  1      SAB      IAB      Ectopic      Multiple      Live Births  1            Home Medications    Prior to Admission medications   Medication  Sig Start Date End Date Taking? Authorizing Provider  benzonatate (TESSALON PERLES) 100 MG capsule Take 1 capsule (100 mg total) by mouth 3 (three) times daily as needed for up to 10 days for cough. 07/28/21 08/07/21 Yes Jabri Blancett-Warren, Sadie Haberhristie J, NP  cetirizine-pseudoephedrine (ZYRTEC-D) 5-120 MG tablet Take 1 tablet by mouth 2 (two) times daily. 07/28/21  Yes Harutyun Monteverde-Warren, Sadie Haberhristie J, NP  b complex vitamins tablet Take 1 tablet by mouth daily.    [provider]  calcium carbonate (OS-CAL) 600 MG TABS tablet Take 600 mg by mouth daily with breakfast.    [provider]  cetirizine (ZYRTEC) 10 MG tablet Take 1 tablet (10 mg total) by mouth at bedtime. 05/17/14   Salley Scarleturham, Kawanta F, MD  hydrochlorothiazide (MICROZIDE) 12.5 MG capsule Take 1 capsule (12.5 mg total) by mouth daily. 05/04/21   Adline PotterGriffin, Jennifer A, NP    Family History Family History  Problem Relation Age of Onset   Diabetes Mother    Hypertension Mother    Heart disease Mother    Arthritis Mother    Hyperlipidemia Mother    Hypertension Sister    Cancer Sister  kidney,lung   Depression Sister    Miscarriages / Stillbirths Sister    Hypertension Brother    Cancer Brother        prostate   Heart disease Brother    Vision loss Maternal Grandfather    Early death Father    Hearing loss Brother    Learning disabilities Brother     Social History Social History   Tobacco Use   Smoking status: Never   Smokeless tobacco: Never  Vaping Use   Vaping Use: Never used  Substance Use Topics   Alcohol use: Yes    Alcohol/week: 0.0 standard drinks    Comment: occasionally    Drug use: No     Allergies   Sulfa antibiotics   Review of Systems Review of Systems Per HPI  Physical Exam Triage Vital Signs ED Triage Vitals  Enc Vitals Group     BP 07/28/21 0814 134/81     Pulse Rate 07/28/21 0814 92     Resp 07/28/21 0814 18     Temp 07/28/21 0814 97.8 F (36.6 C)     Temp Source 07/28/21 0814  Oral     SpO2 07/28/21 0814 93 %     Weight --      Height --      Head Circumference --      Peak Flow --      Pain Score 07/28/21 0815 8     Pain Loc --      Pain Edu? --      Excl. in GC? --    No data found.  Updated Vital Signs BP 134/81 (BP Location: Right Arm)   Pulse 92   Temp 97.8 F (36.6 C) (Oral)   Resp 18   SpO2 93%   Visual Acuity Right Eye Distance:   Left Eye Distance:   Bilateral Distance:    Right Eye Near:   Left Eye Near:    Bilateral Near:     Physical Exam Vitals and nursing note reviewed.  Constitutional:      General: She is not in acute distress.    Appearance: Normal appearance.  HENT:     Head: Normocephalic.     Right Ear: Ear canal and external ear normal.     Left Ear: Tympanic membrane, ear canal and external ear normal.     Ears:     Comments: Right middle ear effusion    Nose: Nose normal.     Mouth/Throat:     Lips: Pink.     Mouth: Mucous membranes are moist.     Pharynx: Uvula midline. Posterior oropharyngeal erythema present. No oropharyngeal exudate.     Tonsils: No tonsillar exudate. 1+ on the right. 1+ on the left.  Eyes:     Extraocular Movements: Extraocular movements intact.     Conjunctiva/sclera: Conjunctivae normal.     Pupils: Pupils are equal, round, and reactive to light.  Cardiovascular:     Rate and Rhythm: Normal rate and regular rhythm.     Pulses: Normal pulses.     Heart sounds: Normal heart sounds.  Pulmonary:     Effort: Pulmonary effort is normal.     Breath sounds: Normal breath sounds.  Abdominal:     General: Bowel sounds are normal.     Palpations: Abdomen is soft.  Musculoskeletal:     Cervical back: Normal range of motion.  Lymphadenopathy:     Cervical: No cervical adenopathy.  Skin:    General: Skin  is warm and dry.  Neurological:     General: No focal deficit present.     Mental Status: She is alert and oriented to person, place, and time.  Psychiatric:        Mood and Affect:  Mood normal.        Behavior: Behavior normal.     UC Treatments / Results  Labs (all labs ordered are listed, but only abnormal results are displayed) Labs Reviewed  CULTURE, GROUP A STREP [facility])  POCT RAPID STREP A (OFFICE)    EKG   Radiology No results found.  Procedures Procedures (including critical care time)  Medications Ordered in UC Medications - No data to display  Initial Impression / Assessment and Plan / UC Course  I have reviewed the triage vital signs and the nursing notes.  Pertinent labs & imaging results that were available during my care of the patient were reviewed by me and considered in my medical decision making (see chart for details).  Presents with complaints of sore throat, and cough.  She also notes some change in her voice over the past 24 hours.  Her exam and vital signs are reassuring at this time.  Her rapid strep test is negative, throat culture is pending.  Symptoms are more likely consistent with an allergic rhinitis.  Symptomatic treatment was provided.  Supportive care recommendations were also provided to the patient.  Patient advised to follow-up if symptoms do not improve. Final Clinical Impressions(s) / UC Diagnoses   Final diagnoses:  Sore throat  Allergic rhinitis, unspecified seasonality, unspecified trigger     Discharge Instructions      The rapid strep test was negative, a throat culture is pending. You will be contacted if the results are positive to provide treatment.  Take medication as prescribed. Increase fluids and allow for plenty of rest. Recommend Tylenol or ibuprofen as needed for pain, fever, or general discomfort. Warm salt water gargles 3-4 times daily to help with throat pain or discomfort. Recommend using a humidifier at bedtime during sleep to help with cough and nasal congestion. Sleep elevated on 2 pillows. Follow-up if your symptoms do not improve.      ED Prescriptions     Medication Sig  Dispense Auth. Provider   cetirizine-pseudoephedrine (ZYRTEC-D) 5-120 MG tablet Take 1 tablet by mouth 2 (two) times daily. 60 tablet Charlsey Moragne-Warren, Sadie Haber, NP   benzonatate (TESSALON PERLES) 100 MG capsule Take 1 capsule (100 mg total) by mouth 3 (three) times daily as needed for up to 10 days for cough. 30 capsule Lyra Alaimo-Warren, Sadie Haber, NP      PDMP not reviewed this encounter.   Abran Cantor, NP 07/28/21 [phone]

## 2021-07-28 NOTE — ED Triage Notes (Signed)
Sore throat and headache on Saturday.  Sore throat continues and right ear pain.

## 2021-07-28 NOTE — Discharge Instructions (Addendum)
The rapid strep test was negative, a throat culture is pending. You will be contacted if the results are positive to provide treatment.  Take medication as prescribed. Increase fluids and allow for plenty of rest. Recommend Tylenol or ibuprofen as needed for pain, fever, or general discomfort. Warm salt water gargles 3-4 times daily to help with throat pain or discomfort. Recommend using a humidifier at bedtime during sleep to help with cough and nasal congestion. Sleep elevated on 2 pillows. Follow-up if your symptoms do not improve.

## 2021-07-30 ENCOUNTER — Telehealth (HOSPITAL_COMMUNITY): Payer: Self-pay | Admitting: Emergency Medicine

## 2021-07-30 LAB — CULTURE, GROUP A STREP (THRC)

## 2021-07-30 MED ORDER — AZITHROMYCIN 250 MG PO TABS: 250.0000 mg | ORAL_TABLET | Freq: Every day | ORAL | 0 refills | Status: DC

## 2021-09-09 IMAGING — MG MM DIGITAL SCREENING BILAT W/ TOMO W/ CAD
6 of 12 series · 6 of 36 positions shown · non-contrast
Comparison: Previous exam(s).

ACR Breast Density Category a: The breast tissue is almost entirely
fatty.

CLINICAL DATA: Screening.

EXAM:
DIGITAL SCREENING BILATERAL MAMMOGRAM WITH TOMO AND CAD

[L MLO synth-2D]
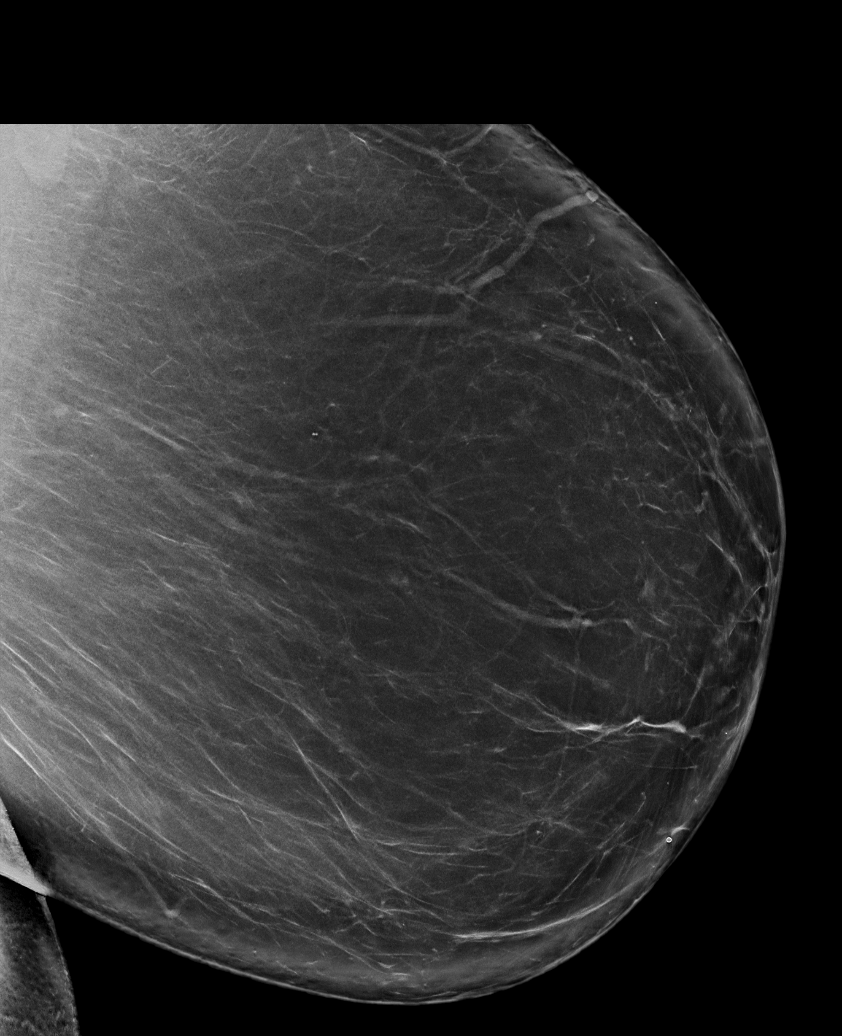

[R MLO synth-2D (1 of 2)]
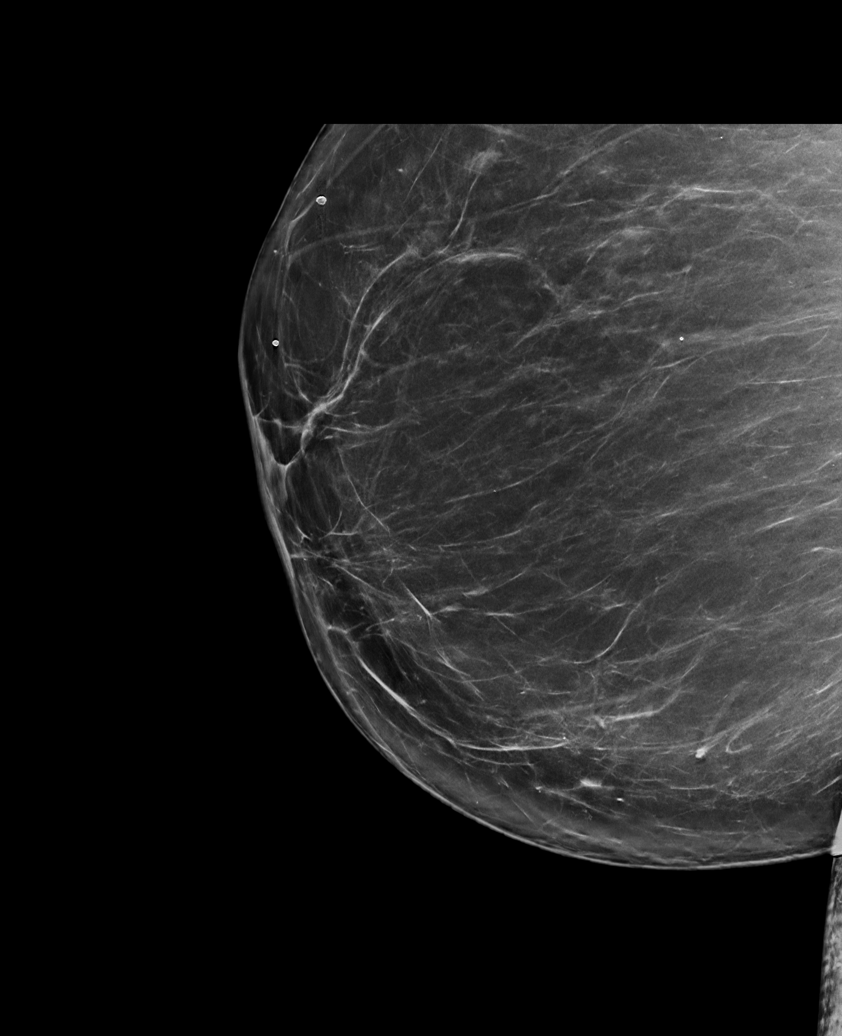

[L CC synth-2D]
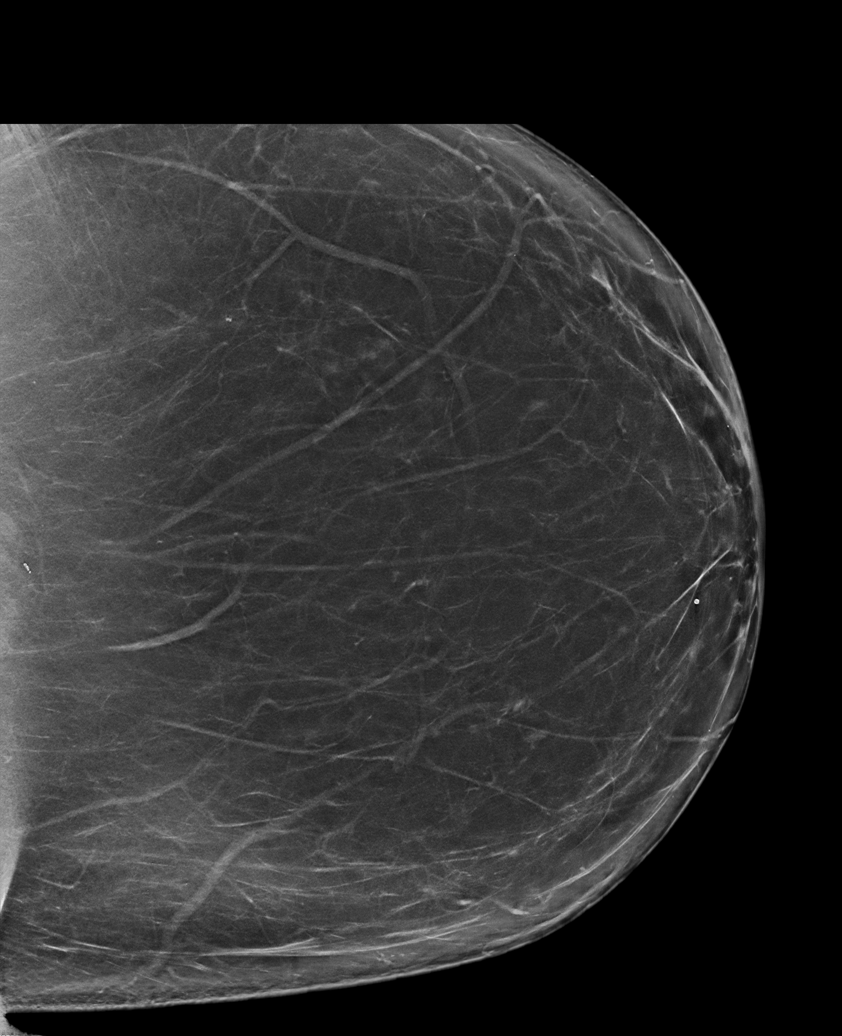

[R CC synth-2D]
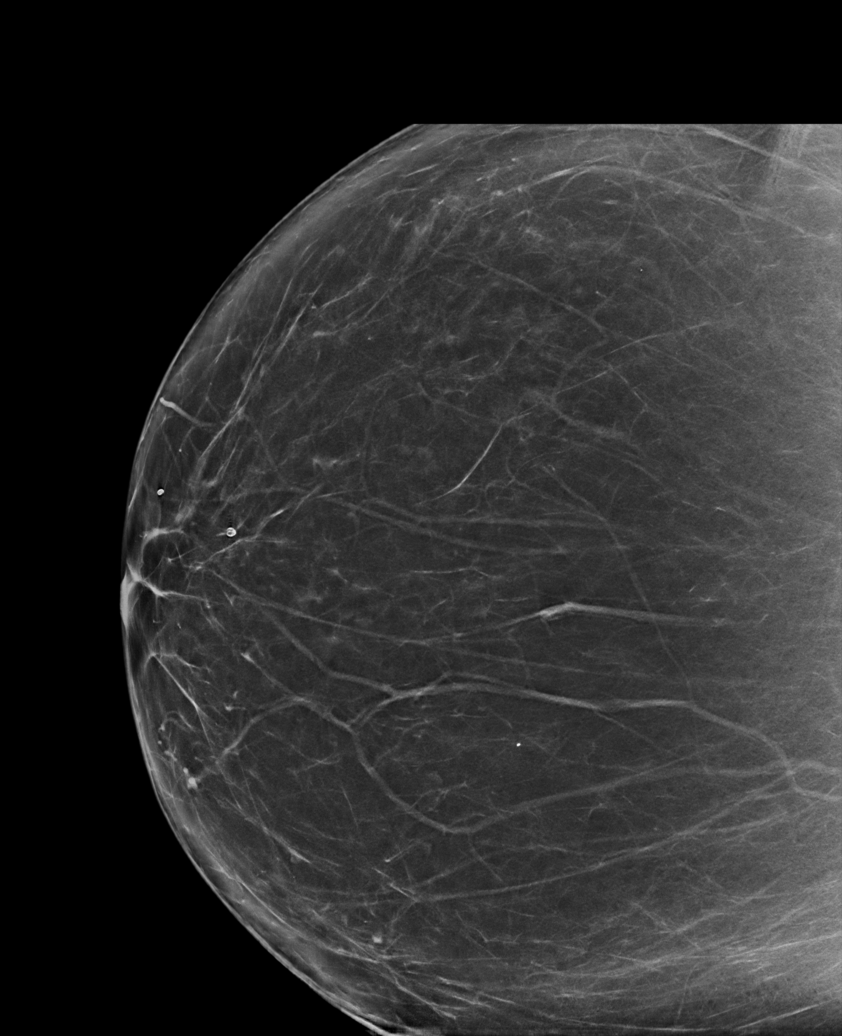

[R CV synth-2D]
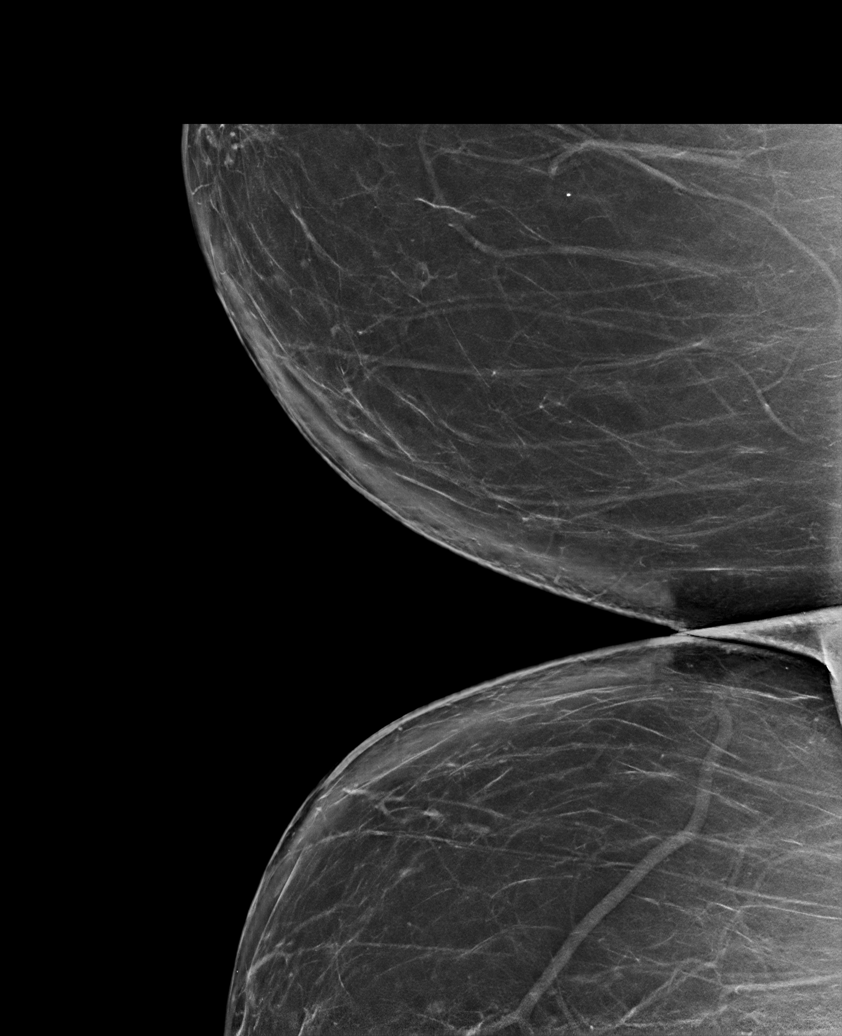

[R MLO synth-2D (2 of 2)]
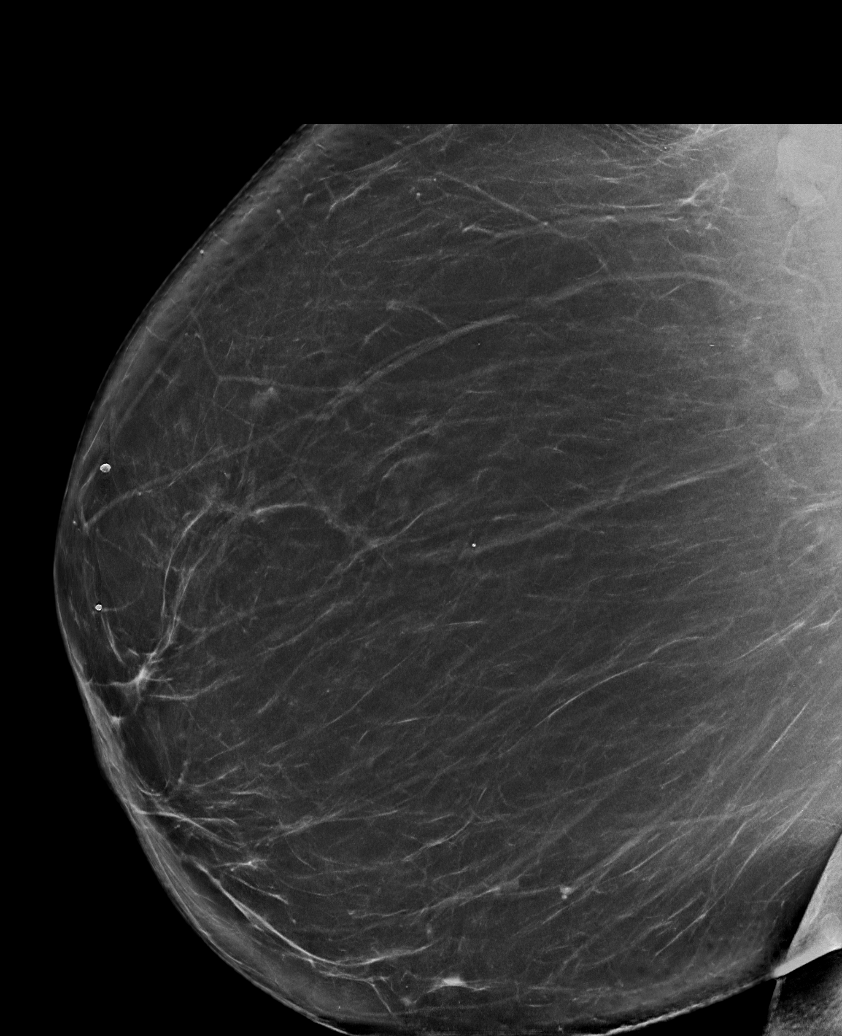

[6 of 36 positions shown; findings below may reference images not displayed]

FINDINGS: There are no findings suspicious for malignancy. Images were
processed with CAD.
IMPRESSION: No mammographic evidence of malignancy. A result letter of this
screening mammogram will be mailed directly to the patient.

RECOMMENDATION:
Screening mammogram in one year. (Code:8Y-[id number])

BI-RADS CATEGORY  1: Negative.

## 2021-11-18 ENCOUNTER — Ambulatory Visit (INDEPENDENT_AMBULATORY_CARE_PROVIDER_SITE_OTHER): Payer: 59

## 2021-11-18 ENCOUNTER — Ambulatory Visit: Payer: Self-pay

## 2021-11-18 ENCOUNTER — Ambulatory Visit: Payer: 59 | Admitting: Family

## 2021-11-18 DIAGNOSIS — M25561 Pain in right knee: Secondary | ICD-10-CM

## 2021-11-18 DIAGNOSIS — M25562 Pain in left knee: Secondary | ICD-10-CM | POA: Diagnosis not present

## 2021-11-18 DIAGNOSIS — M1712 Unilateral primary osteoarthritis, left knee: Secondary | ICD-10-CM

## 2021-11-18 DIAGNOSIS — M1711 Unilateral primary osteoarthritis, right knee: Secondary | ICD-10-CM

## 2021-11-18 DIAGNOSIS — G8929 Other chronic pain: Secondary | ICD-10-CM

## 2022-01-06 NOTE — Progress Notes (Signed)
Office Visit Note   Patient: Toni Beard           Date of Birth: 09-Feb-1961           MRN: [id number] Visit Date: 11/18/2021              Requested by: No referring provider defined for this encounter. PCP: Pcp, No  Chief Complaint  Patient presents with   Left Knee - Pain   Right Knee - Pain      HPI: The patient is a 61 year old woman who comes in today complaining bilateral knee pain.  Right worse than left.  Primarily over the medial joint line she states that she is having tightness above the knee especially on the right this is associated with burning sensation.  She does have mechanical symptoms of the knee start up stiffness.  She has had loss of range of motion especially painful and steps and inclines  Assessment & Plan: Visit Diagnoses:  1. Chronic pain of both knees     Plan: Osteoarthritis bilateral knees.  Discussed conservative measures.  Offered Depo-Medrol injections.  Follow-Up Instructions: No follow-ups on file.   Right Knee Exam   Muscle Strength  The patient has normal right knee strength.  Tenderness  The patient is experiencing tenderness in the medial joint line.  Tests  Varus: negative Valgus: negative   Left Knee Exam   Muscle Strength  The patient has normal left knee strength.  Tenderness  The patient is experiencing tenderness in the medial joint line.  Tests  Varus: negative Valgus: negative      Patient is alert, oriented, no adenopathy, well-dressed, normal affect, normal respiratory effort.   Imaging: No results found. No images are attached to the encounter.  Labs: Lab Results  Component Value Date   HGBA1C 5.4 09/24/2019   REPTSTATUS 07/30/2021 FINAL 07/28/2021   CULT  07/28/2021    FEW STREPTOCOCCUS,BETA HEMOLYTIC NOT GROUP A Beta hemolytic streptococci are predictably susceptible to penicillin and other beta lactams. Susceptibility testing not routinely performed. Performed at [facility],  [address], [place], [place] [address]      Lab Results  Component Value Date   ALBUMIN 3.7 06/28/2016   ALBUMIN 3.7 04/02/2015   ALBUMIN 3.0 (L) 03/07/2013    No results found for: "MG" Lab Results  Component Value Date   VD25OH 34 04/02/2015    No results found for: "PREALBUMIN"    Latest Ref Rng & Units 09/24/2019    8:55 AM 09/20/2018   10:16 AM 12/26/2017    9:14 AM  CBC EXTENDED  WBC 3.8 - 10.8 Thousand/uL 9.5  8.9  8.1   RBC 3.80 - 5.10 Million/uL 4.66  4.45  4.71   Hemoglobin 11.7 - 15.5 g/dL 29.5  18.8  41.6   HCT 35.0 - 45.0 % 43.0  40.1  42.0   Platelets 140 - 400 Thousand/uL 283  299  296   NEUT# 1,500 - 7,800 cells/uL 5,938  5,776  4,763   Lymph# 850 - 3,900 cells/uL 2,727  2,225  2,633      There is no height or weight on file to calculate BMI.  Orders:  Orders Placed This Encounter  Procedures   XR Knee 1-2 Views Right   XR Knee 1-2 Views Left   No orders of the defined types were placed in this encounter.    Procedures: No procedures performed  Clinical Data: No additional findings.  ROS:  All  other systems negative, except as noted in the HPI. Review of Systems  Objective: Vital Signs: There were no vitals taken for this visit.  Specialty Comments:  No specialty comments available.  PMFS History: Patient Active Problem List   Diagnosis Date Noted   Sebaceous cyst of labia 05/04/2021   Encounter for screening fecal occult blood testing 05/04/2021   Encounter for gynecological examination with Papanicolaou smear of cervix 05/04/2021   Postmenopause 05/04/2021   Varicose veins of both lower extremities 06/28/2016   Tinnitus 04/02/2015   Class 3 obesity (HCC) 05/17/2014   Seasonal allergies    Carcinoma in situ of cervix uteri 02/06/2013   Hypertension 01/12/2013   Past Medical History:  Diagnosis Date   Allergy    seasonal   Contraceptive management 01/12/2013   Hypertension    Obesity     Family History  Problem Relation  Age of Onset   Diabetes Mother    Hypertension Mother    Heart disease Mother    Arthritis Mother    Hyperlipidemia Mother    Hypertension Sister    Cancer Sister        kidney,lung   Depression Sister    Miscarriages / Stillbirths Sister    Hypertension Brother    Cancer Brother        prostate   Heart disease Brother    Vision loss Maternal Grandfather    Early death Father    Hearing loss Brother    Learning disabilities Brother     Past Surgical History:  Procedure Laterality Date   CERVICAL CONIZATION W/BX N/A 03/14/2013   Procedure: CONIZATION CERVIX ;  Surgeon: Lazaro Arms, MD;  Location: [facility];  Service: Gynecology;  Laterality: N/A;   HOLMIUM LASER APPLICATION N/A 03/14/2013   Procedure: HOLMIUM LASER APPLICATION;  Surgeon: Lazaro Arms, MD;  Location: [facility];  Service: Gynecology;  Laterality: N/A;   Social History   Occupational History   Not on file  Tobacco Use   Smoking status: Never   Smokeless tobacco: Never  Vaping Use   Vaping Use: Never used  Substance and Sexual Activity   Alcohol use: Yes    Alcohol/week: 0.0 standard drinks of alcohol    Comment: occasionally    Drug use: No   Sexual activity: Yes    Birth control/protection: Condom, Post-menopausal

## 2022-05-24 ENCOUNTER — Encounter: Payer: Self-pay | Admitting: Family Medicine

## 2022-05-24 ENCOUNTER — Ambulatory Visit (INDEPENDENT_AMBULATORY_CARE_PROVIDER_SITE_OTHER): Payer: 59 | Admitting: Family Medicine

## 2022-05-24 VITALS — BP 139/83 | HR 100 | Ht 66.0 in | Wt 264.0 lb

## 2022-05-24 DIAGNOSIS — Z131 Encounter for screening for diabetes mellitus: Secondary | ICD-10-CM

## 2022-05-24 DIAGNOSIS — I10 Essential (primary) hypertension: Secondary | ICD-10-CM | POA: Diagnosis not present

## 2022-05-24 DIAGNOSIS — Z13220 Encounter for screening for lipoid disorders: Secondary | ICD-10-CM

## 2022-05-24 DIAGNOSIS — Z1329 Encounter for screening for other suspected endocrine disorder: Secondary | ICD-10-CM

## 2022-05-24 DIAGNOSIS — Z0001 Encounter for general adult medical examination with abnormal findings: Secondary | ICD-10-CM | POA: Diagnosis not present

## 2022-05-24 DIAGNOSIS — Z1231 Encounter for screening mammogram for malignant neoplasm of breast: Secondary | ICD-10-CM

## 2022-05-24 MED ORDER — HYDROCHLOROTHIAZIDE 12.5 MG PO CAPS: 12.5000 mg | ORAL_CAPSULE | Freq: Every day | ORAL | 3 refills | Status: DC

## 2022-05-24 NOTE — Progress Notes (Signed)
Complete physical exam  Patient: Toni Beard   DOB: Jul 02, 1960   62 y.o. Female  MRN: [id number]  Subjective:    Chief Complaint  Patient presents with   Toni Beard is a 62 y.o. female who presents today for a complete physical exam. She reports consuming a general diet.  Patient reports walking daily and uses the treadmill.  She generally feels well. She reports sleeping fairly well. She does not have additional problems to discuss today.    Most recent fall risk assessment:    05/24/2022    3:20 PM  [place] in the past year? 0  Number falls in past yr: 0  Injury with Fall? 0  Risk for fall due to : No Fall Risks  Follow up Falls evaluation completed     Most recent depression screenings:    05/24/2022    3:20 PM 05/04/2021    1:39 PM  PHQ 2/9 Scores  PHQ - 2 Score 1 0  PHQ- 9 Score 4 4    Vision:Not within last year  and Dental: No current dental problems and Receives regular dental care  Patient Care Team: Del Ria Comment, Lamar Benes, FNP as PCP - General (Family Medicine)   Outpatient Medications Prior to Visit  Medication Sig   b complex vitamins tablet Take 1 tablet by mouth daily.   calcium carbonate (OS-CAL) 600 MG TABS tablet Take 600 mg by mouth daily with breakfast.   [DISCONTINUED] hydrochlorothiazide (MICROZIDE) 12.5 MG capsule Take 1 capsule (12.5 mg total) by mouth daily.   [DISCONTINUED] azithromycin (ZITHROMAX) 250 MG tablet Take 1 tablet (250 mg total) by mouth daily. Take first 2 tablets together, then 1 every day until finished.   [DISCONTINUED] cetirizine (ZYRTEC) 10 MG tablet Take 1 tablet (10 mg total) by mouth at bedtime.   [DISCONTINUED] cetirizine-pseudoephedrine (ZYRTEC-D) 5-120 MG tablet Take 1 tablet by mouth 2 (two) times daily.   No facility-administered medications prior to visit.    Review of Systems  Constitutional:  Negative for chills and fever.  HENT:  Negative for ear pain.   Respiratory:   Negative for shortness of breath.   Cardiovascular:  Negative for chest pain.  Gastrointestinal:  Negative for abdominal pain, nausea and vomiting.  Genitourinary:  Negative for dysuria.  Musculoskeletal:  Positive for myalgias.  Neurological:  Negative for dizziness and headaches.  Psychiatric/Behavioral:  Negative for depression. The patient is not nervous/anxious.        Objective:    BP 139/83   Pulse 100   Ht 5\' 6"  (1.676 m)   Wt 264 lb (119.7 kg)   SpO2 95%   BMI 42.61 kg/m  BP Readings from Last 3 Encounters:  05/24/22 139/83  07/28/21 134/81  05/04/21 (!) 162/100      Physical Exam Vitals reviewed.  Constitutional:      General: She is not in acute distress. HENT:     Head: Normocephalic.     Right Ear: Tympanic membrane normal.     Left Ear: Tympanic membrane and ear canal normal.     Nose: Nose normal.     Mouth/Throat:     Mouth: Mucous membranes are moist.     Pharynx: No oropharyngeal exudate or posterior oropharyngeal erythema.  Eyes:     Extraocular Movements: Extraocular movements intact.  Cardiovascular:     Rate and Rhythm: Normal rate and regular rhythm.     Pulses: Normal pulses.  Heart sounds: Normal heart sounds.  Pulmonary:     Effort: Pulmonary effort is normal.     Breath sounds: Normal breath sounds.  Abdominal:     General: Abdomen is flat. Bowel sounds are normal. There is no distension.     Palpations: Abdomen is soft.     Tenderness: There is no right CVA tenderness, left CVA tenderness, guarding or rebound.  Genitourinary:    General: Normal vulva.  Musculoskeletal:        General: Normal range of motion.     Cervical back: Normal range of motion and neck supple. No tenderness.     Right lower leg: No edema.     Left lower leg: No edema.  Skin:    General: Skin is warm and dry.     Capillary Refill: Capillary refill takes less than 2 seconds.  Neurological:     General: No focal deficit present.     Mental Status: She is  alert.     Coordination: Coordination normal.     Gait: Gait normal.  Psychiatric:        Thought Content: Thought content normal.      No results found for any visits on 05/24/22.    Assessment & Plan:    Routine Health Maintenance and Physical Exam  Immunization History  Administered Date(s) Administered   Influenza,inj,Quad PF,6+ Mos 01/15/2020   Influenza-Unspecified 12/31/1996   Moderna Sars-Covid-2 Vaccination 05/12/2019, 06/13/2019, 01/25/2020   Tdap 05/17/2014, 10/15/2020   Zoster Recombinat (Shingrix) 09/20/2018, 03/21/2019    Health Maintenance  Topic Date Due   COVID-19 Vaccine (4 - 2023-24 season) 06/09/2022 (Originally 10/23/2021)   INFLUENZA VACCINE  09/23/2022   MAMMOGRAM  11/12/2022   PAP SMEAR-Modifier  05/04/2024   COLONOSCOPY (Pts 45-36yrs Insurance coverage will need to be confirmed)  08/09/2026   DTaP/Tdap/Td (3 - Td or Tdap) 10/16/2030   Hepatitis C Screening  Completed   HIV Screening  Completed   Zoster Vaccines- Shingrix  Completed   HPV VACCINES  Aged Out    Discussed health benefits of physical activity, and encouraged her to engage in regular exercise appropriate for her age and condition.  Primary hypertension -     CBC with Differential/Platelet -     CMP14+EGFR -     Microalbumin / creatinine urine ratio -     hydroCHLOROthiazide; Take 1 capsule (12.5 mg total) by mouth daily.  Dispense: 90 capsule; Refill: 3  Screening for diabetes mellitus -     Hemoglobin A1c  Screening for lipid disorders -     Lipid panel  Screening for thyroid disorder -     TSH + free T4  Encounter for screening mammogram for malignant neoplasm of breast -     Digital Screening Mammogram, Left and Right; Future  Encounter for routine adult physical exam with abnormal findings Assessment & Plan: Physical exam done, labs ordered  Updated screening and health maintenance  Exercise and nutrition counseling BMI  42.61 Discussed lifestyle modifications  follow diet low in saturated fat, reduce dietary salt intake, avoid fatty foods, maintain an exercise routine 3 to 5 days a week for a minimum total of 150 minutes.      Return in about 1 month (around 06/23/2022) for Weight Loss Mangment.     Renard Hamper Ria Comment, FNP

## 2022-05-24 NOTE — Patient Instructions (Signed)
It was pleasure meeting with you today. Please take medications as prescribed. Follow up with your primary health provider if any health concerns arises. If symptoms worsen please contact your primary care provider and/or visit the emergency department.  

## 2022-05-24 NOTE — Assessment & Plan Note (Signed)
Physical exam done, labs ordered  Updated screening and health maintenance  Exercise and nutrition counseling BMI  42.61 Discussed lifestyle modifications follow diet low in saturated fat, reduce dietary salt intake, avoid fatty foods, maintain an exercise routine 3 to 5 days a week for a minimum total of 150 minutes.

## 2022-05-27 LAB — CMP14+EGFR
ALT: 10 IU/L (ref 0–32)
AST: 18 IU/L (ref 0–40)
Albumin/Globulin Ratio: 1.7 (ref 1.2–2.2)
Albumin: 3.9 g/dL (ref 3.8–4.9)
Alkaline Phosphatase: 87 IU/L (ref 44–121)
BUN/Creatinine Ratio: 15 (ref 12–28)
BUN: 11 mg/dL (ref 8–27)
Bilirubin Total: 0.6 mg/dL (ref 0.0–1.2)
CO2: 23 mmol/L (ref 20–29)
Calcium: 9.3 mg/dL (ref 8.7–10.3)
Chloride: 102 mmol/L (ref 96–106)
Creatinine, Ser: 0.72 mg/dL (ref 0.57–1.00)
Globulin, Total: 2.3 g/dL (ref 1.5–4.5)
Glucose: 85 mg/dL (ref 70–99)
Potassium: 3.9 mmol/L (ref 3.5–5.2)
Sodium: 140 mmol/L (ref 134–144)
Total Protein: 6.2 g/dL (ref 6.0–8.5)
eGFR: 96 mL/min/{1.73_m2} (ref >59)

## 2022-05-27 LAB — CBC WITH DIFFERENTIAL/PLATELET
Basophils Absolute: 0 10*3/uL (ref 0.0–0.2)
Basos: 0 %
EOS (ABSOLUTE): 0.5 10*3/uL — ABNORMAL HIGH (ref 0.0–0.4)
Eos: 5 %
Hematocrit: 41.6 % (ref 34.0–46.6)
Hemoglobin: 13.7 g/dL (ref 11.1–15.9)
Immature Grans (Abs): 0 10*3/uL (ref 0.0–0.1)
Immature Granulocytes: 0 %
Lymphocytes Absolute: 2.9 10*3/uL (ref 0.7–3.1)
Lymphs: 28 %
MCH: 29.8 pg (ref 26.6–33.0)
MCHC: 32.9 g/dL (ref 31.5–35.7)
MCV: 90 fL (ref 79–97)
Monocytes Absolute: 0.6 10*3/uL (ref 0.1–0.9)
Monocytes: 6 %
Neutrophils Absolute: 6.3 10*3/uL (ref 1.4–7.0)
Neutrophils: 61 %
Platelets: 316 10*3/uL (ref 150–450)
RBC: 4.6 x10E6/uL (ref 3.77–5.28)
RDW: 12.8 % (ref 11.7–15.4)
WBC: 10.2 10*3/uL (ref 3.4–10.8)

## 2022-05-27 LAB — LIPID PANEL
Chol/HDL Ratio: 3.7 ratio (ref 0.0–4.4)
Cholesterol, Total: 176 mg/dL (ref 100–199)
HDL: 47 mg/dL (ref >39)
LDL Chol Calc (NIH): 107 mg/dL — ABNORMAL HIGH (ref 0–99)
Triglycerides: 123 mg/dL (ref 0–149)
VLDL Cholesterol Cal: 22 mg/dL (ref 5–40)

## 2022-05-27 LAB — MICROALBUMIN / CREATININE URINE RATIO
Creatinine, Urine: 152.8 mg/dL
Microalb/Creat Ratio: 6 mg/g creat (ref 0–29)
Microalbumin, Urine: 9.9 ug/mL

## 2022-05-27 LAB — HEMOGLOBIN A1C
Est. average glucose Bld gHb Est-mCnc: 117 mg/dL
Hgb A1c MFr Bld: 5.7 % — ABNORMAL HIGH (ref 4.8–5.6)

## 2022-05-27 LAB — TSH+FREE T4
Free T4: 0.94 ng/dL (ref 0.82–1.77)
TSH: 3.14 u[IU]/mL (ref 0.450–4.500)

## 2022-06-23 ENCOUNTER — Encounter: Payer: Self-pay | Admitting: Family Medicine

## 2022-06-23 ENCOUNTER — Other Ambulatory Visit: Payer: Self-pay | Admitting: Family Medicine

## 2022-06-23 ENCOUNTER — Ambulatory Visit (INDEPENDENT_AMBULATORY_CARE_PROVIDER_SITE_OTHER): Payer: 59 | Admitting: Family Medicine

## 2022-06-23 VITALS — BP 126/79 | HR 111 | Resp 16 | Ht 66.0 in | Wt 264.0 lb

## 2022-06-23 DIAGNOSIS — Z6841 Body Mass Index (BMI) 40.0 and over, adult: Secondary | ICD-10-CM

## 2022-06-23 DIAGNOSIS — Z800 Family history of malignant neoplasm of digestive organs: Secondary | ICD-10-CM

## 2022-06-23 DIAGNOSIS — I10 Essential (primary) hypertension: Secondary | ICD-10-CM

## 2022-06-23 MED ORDER — HYDROCHLOROTHIAZIDE 12.5 MG PO CAPS: 12.5000 mg | ORAL_CAPSULE | Freq: Every day | ORAL | 1 refills | Status: DC

## 2022-06-23 NOTE — Assessment & Plan Note (Signed)
Started patient on weight management plan LDL 107 , LDL goal <70 , Hemoglobin W0J 5.7 - prediabetic  Discussed the importance to start eating 3 meals a day including breakfast, drink 8 glasses of water a day ,reduce portion sizes. reduced carbohydrates limit saturated and trans fat, increase servings of vegetables and limit processed foods. Find an activity that you will enjoy and start to be active at least 5 days a week for 30 minutes each day. Keep a food journal or an activity journal to identify triggers that lead to emotional eating Follow up in 4 weeks for Weight loss management

## 2022-06-23 NOTE — Progress Notes (Signed)
Patient Office Visit   Subjective   Patient ID: Toni Beard, female    DOB: 03-16-1960  Age: 62 y.o. MRN: [id number]  CC:  Chief Complaint  Patient presents with   Weight Check    1 month follow up for weight management and needs refill on HCTZ    HPI Toni Beard 62 year old female, presents to the clinic for weight loss management. She  has a past medical history of Allergy, Contraceptive management (01/12/2013), Hypertension, and Obesity.  Obesity: Patient complains of obesity. Patient cites health, increased physical ability, self-image as reasons for wanting to lose weight.  Obesity History Weight in late teens: 150 lb. Period of greatest weight gain: 10 lb during mid adult years Lowest adult weight: 150 lb Highest adult weight: 264 lb Amount of time at present weight: 264 lb.   History of Weight Loss Efforts Greatest amount of weight lost: 30 lb over 2 months Amount of time that loss was maintained: 6 months Circumstances associated with regain of weight: eating habits Successful weight loss techniques attempted: very low calorie diet Unsuccessful weight loss techniques attempted: self-directed dieting  Current Exercise Habits: walking twice a week and 30 minutes on bicycle   Current Eating Habits Number of regular meals per day: 2 Number of snacking episodes per day: several Who shops for food? patient Who prepares food? patient Who eats with patient? patient Binge behavior?: no Purge behavior? no Anorexic behavior? no Eating precipitated by stress? yes  Guilt feelings associated with eating? no  Other Potential Contributing Factors Use of alcohol: average 0 drinks/week Use of medications that may cause weight gain none History of past abuse? none Psych History: obesity Comorbidities: dyslipidemias and hypertension        Outpatient Encounter Medications as of 06/23/2022  Medication Sig   b complex vitamins tablet Take 1 tablet by mouth daily.    calcium carbonate (OS-CAL) 600 MG TABS tablet Take 600 mg by mouth daily with breakfast.   [DISCONTINUED] hydrochlorothiazide (MICROZIDE) 12.5 MG capsule Take 1 capsule (12.5 mg total) by mouth daily.   hydrochlorothiazide (MICROZIDE) 12.5 MG capsule Take 1 capsule (12.5 mg total) by mouth daily.   No facility-administered encounter medications on file as of 06/23/2022.    Past Surgical History:  Procedure Laterality Date   CERVICAL CONIZATION W/BX N/A 03/14/2013   Procedure: CONIZATION CERVIX ;  Surgeon: Lazaro Arms, MD;  Location: [facility];  Service: Gynecology;  Laterality: N/A;   HOLMIUM LASER APPLICATION N/A 03/14/2013   Procedure: HOLMIUM LASER APPLICATION;  Surgeon: Lazaro Arms, MD;  Location: [facility];  Service: Gynecology;  Laterality: N/A;    Review of Systems  Constitutional:  Negative for chills and fever.  Eyes:  Negative for blurred vision.  Respiratory:  Negative for shortness of breath.   Cardiovascular:  Negative for chest pain.  Gastrointestinal:  Negative for abdominal pain and heartburn.  Genitourinary:  Negative for dysuria.  Musculoskeletal:  Negative for myalgias.  Skin:  Negative for rash.  Neurological:  Negative for dizziness.      Objective    BP 126/79   Pulse (!) 111   Resp 16   Ht 5\' 6"  (1.676 m)   Wt 264 lb (119.7 kg)   SpO2 93%   BMI 42.61 kg/m   Physical Exam Vitals reviewed.  Constitutional:      General: She is not in acute distress.    Appearance: Normal appearance. She is obese. She is not ill-appearing, toxic-appearing  or diaphoretic.  HENT:     Head: Normocephalic.  Eyes:     General:        Right eye: No discharge.        Left eye: No discharge.     Conjunctiva/sclera: Conjunctivae normal.  Cardiovascular:     Rate and Rhythm: Normal rate.     Pulses: Normal pulses.     Heart sounds: Normal heart sounds.  Pulmonary:     Effort: Pulmonary effort is normal. No respiratory distress.     Breath sounds: Normal breath sounds.   Musculoskeletal:        General: Normal range of motion.     Cervical back: Normal range of motion.  Skin:    General: Skin is warm and dry.     Capillary Refill: Capillary refill takes less than 2 seconds.  Neurological:     General: No focal deficit present.     Mental Status: She is alert and oriented to person, place, and time.     Coordination: Coordination normal.     Gait: Gait normal.  Psychiatric:        Mood and Affect: Mood normal.        Behavior: Behavior normal.       Assessment & Plan:  Class 3 severe obesity due to excess calories with serious comorbidity and body mass index (BMI) of 40.0 to 44.9 in adult [facility]) Assessment & Plan: Started patient on weight management plan LDL 107 , LDL goal <70 , Hemoglobin Z6X 5.7 - prediabetic  Discussed the importance to start eating 3 meals a day including breakfast, drink 8 glasses of water a day ,reduce portion sizes. reduced carbohydrates limit saturated and trans fat, increase servings of vegetables and limit processed foods. Find an activity that you will enjoy and start to be active at least 5 days a week for 30 minutes each day. Keep a food journal or an activity journal to identify triggers that lead to emotional eating Follow up in 4 weeks for Weight loss management     Primary hypertension -     hydroCHLOROthiazide; Take 1 capsule (12.5 mg total) by mouth daily.  Dispense: 90 capsule; Refill: 1    Return in about 4 weeks (around 07/21/2022) for Weight Loss Mangment.   Cruzita Lederer Newman Nip, FNP

## 2022-06-23 NOTE — Patient Instructions (Signed)
        Great to see you today.  I have refilled the medication(s) we provide.    - Please take medications as prescribed. - Follow up with your primary health provider if any health concerns arises. - If symptoms worsen please contact your primary care provider and/or visit the emergency department.  

## 2022-07-02 ENCOUNTER — Ambulatory Visit
Admission: RE | Admit: 2022-07-02 | Discharge: 2022-07-02 | Disposition: A | Discharge status: Home / Self Care | Payer: 59 | Source: Ambulatory Visit | Attending: Family Medicine | Admitting: Family Medicine

## 2022-07-02 DIAGNOSIS — Z1231 Encounter for screening mammogram for malignant neoplasm of breast: Secondary | ICD-10-CM

## 2022-07-22 ENCOUNTER — Ambulatory Visit: Payer: 59 | Admitting: Family Medicine

## 2022-08-02 ENCOUNTER — Ambulatory Visit (INDEPENDENT_AMBULATORY_CARE_PROVIDER_SITE_OTHER): Payer: 59 | Admitting: Family Medicine

## 2022-08-02 VITALS — BP 138/85 | HR 104 | Ht 67.0 in | Wt 269.0 lb

## 2022-08-02 DIAGNOSIS — Z6841 Body Mass Index (BMI) 40.0 and over, adult: Secondary | ICD-10-CM | POA: Diagnosis not present

## 2022-08-02 MED ORDER — SEMAGLUTIDE-WEIGHT MANAGEMENT 0.25 MG/0.5ML ~~LOC~~ SOAJ: 0.2500 mg | SUBCUTANEOUS | 0 refills | Status: AC

## 2022-08-02 NOTE — Progress Notes (Signed)
Patient Office Visit   Subjective   Patient ID: Toni Beard, female    DOB: 1960/12/23  Age: 62 y.o. MRN: [id number]  CC:  Chief Complaint  Patient presents with   Weight Loss    Patient is here for weight loss management. No changes or concerns since last visit.     HPI Toni Beard 62 year old female, presents to the clinic for weight loss management. She  has a past medical history of Allergy, Contraceptive management (01/12/2013), Hypertension, and Obesity.For the details of today's visit, please refer to assessment and plan.   HPI    Outpatient Encounter Medications as of 08/02/2022  Medication Sig   b complex vitamins tablet Take 1 tablet by mouth daily.   calcium carbonate (OS-CAL) 600 MG TABS tablet Take 600 mg by mouth daily with breakfast.   hydrochlorothiazide (MICROZIDE) 12.5 MG capsule Take 1 capsule (12.5 mg total) by mouth daily.   Semaglutide-Weight Management 0.25 MG/0.5ML SOAJ Inject 0.25 mg into the skin once a week for 28 days.   No facility-administered encounter medications on file as of 08/02/2022.    Past Surgical History:  Procedure Laterality Date   CERVICAL CONIZATION W/BX N/A 03/14/2013   Procedure: CONIZATION CERVIX ;  Surgeon: Lazaro Arms, MD;  Location: [facility];  Service: Gynecology;  Laterality: N/A;   HOLMIUM LASER APPLICATION N/A 03/14/2013   Procedure: HOLMIUM LASER APPLICATION;  Surgeon: Lazaro Arms, MD;  Location: [facility];  Service: Gynecology;  Laterality: N/A;    Review of Systems  Constitutional:  Negative for chills and fever.  Eyes:  Negative for blurred vision.  Respiratory:  Negative for shortness of breath.   Cardiovascular:  Negative for chest pain.  Gastrointestinal:  Negative for abdominal pain.  Genitourinary:  Negative for dysuria.  Musculoskeletal:  Negative for myalgias.  Skin:  Negative for rash.  Neurological:  Negative for dizziness and headaches.      Objective    BP 138/85   Pulse (!) 104   Ht 5\' 7"   (1.702 m)   Wt 269 lb (122 kg)   SpO2 95%   BMI 42.13 kg/m   Physical Exam Vitals reviewed.  Constitutional:      General: She is not in acute distress.    Appearance: Normal appearance. She is not ill-appearing, toxic-appearing or diaphoretic.  HENT:     Head: Normocephalic.  Eyes:     General:        Right eye: No discharge.        Left eye: No discharge.     Conjunctiva/sclera: Conjunctivae normal.  Cardiovascular:     Rate and Rhythm: Normal rate.     Pulses: Normal pulses.     Heart sounds: Normal heart sounds.  Pulmonary:     Effort: Pulmonary effort is normal. No respiratory distress.     Breath sounds: Normal breath sounds.  Abdominal:     General: Bowel sounds are normal.     Palpations: Abdomen is soft.     Tenderness: There is no abdominal tenderness. There is no guarding.  Musculoskeletal:        General: Normal range of motion.     Cervical back: Normal range of motion.  Skin:    General: Skin is warm and dry.     Capillary Refill: Capillary refill takes less than 2 seconds.  Neurological:     General: No focal deficit present.     Mental Status: She is alert and oriented  to person, place, and time.     Coordination: Coordination normal.     Gait: Gait normal.  Psychiatric:        Mood and Affect: Mood normal.        Behavior: Behavior normal.       Assessment & Plan:  Class 3 severe obesity due to excess calories with serious comorbidity and body mass index (BMI) of 40.0 to 44.9 in adult [facility]) Assessment & Plan: Started Wegovy 0.25 mg injection once weekly Follow up in 4 weeks. Continued discussion adhering to weight loss plan, with strong emphasize on nutrition and exercise. Read food labels to know how many calories are in each serving , Increase water intake 2-3 L a day. Include more protein intake such as lean meat, poultry, fish. Increase fiber intake such as vegetables, whole grains, fruits, artichokes, green peas, broccoli, lentils and lima  beans.   Orders: -     Semaglutide-Weight Management; Inject 0.25 mg into the skin once a week for 28 days.  Dispense: 2 mL; Refill: 0    Return in about 4 weeks (around 08/30/2022) for Weight Loss Mangment.   Cruzita Lederer Newman Nip, FNP

## 2022-08-02 NOTE — Assessment & Plan Note (Addendum)
Started Wegovy 0.25 mg injection once weekly Follow up in 4 weeks. Continued discussion adhering to weight loss plan, with strong emphasize on nutrition and exercise. Read food labels to know how many calories are in each serving , Increase water intake 2-3 L a day. Include more protein intake such as lean meat, poultry, fish. Increase fiber intake such as vegetables, whole grains, fruits, artichokes, green peas, broccoli, lentils and lima beans.

## 2022-08-02 NOTE — Patient Instructions (Signed)

## 2022-08-07 LAB — CANCER ANTIGEN 19-9: CA 19-9: 5 U/mL (ref 0–35)

## 2022-08-30 ENCOUNTER — Ambulatory Visit: Payer: 59 | Admitting: Family Medicine

## 2023-01-04 ENCOUNTER — Ambulatory Visit: Payer: 59 | Admitting: Family Medicine

## 2023-06-22 ENCOUNTER — Other Ambulatory Visit: Payer: Self-pay | Admitting: Family Medicine

## 2023-06-22 DIAGNOSIS — I10 Essential (primary) hypertension: Secondary | ICD-10-CM

## 2023-07-14 ENCOUNTER — Ambulatory Visit
Admission: EM | Admit: 2023-07-14 | Discharge: 2023-07-14 | Disposition: A | Discharge status: Home / Self Care | Attending: Family Medicine | Admitting: Family Medicine

## 2023-07-14 DIAGNOSIS — R197 Diarrhea, unspecified: Secondary | ICD-10-CM | POA: Diagnosis not present

## 2023-07-14 DIAGNOSIS — R112 Nausea with vomiting, unspecified: Secondary | ICD-10-CM

## 2023-07-14 MED ORDER — ONDANSETRON 4 MG PO TBDP
4.0000 mg | ORAL_TABLET | Freq: Once | ORAL | Status: AC
  Administered 2023-07-14: 4 mg via ORAL

## 2023-07-14 MED ORDER — ONDANSETRON 4 MG PO TBDP: 4.0000 mg | ORAL_TABLET | Freq: Three times a day (TID) | ORAL | 0 refills | Status: DC | PRN

## 2023-07-14 NOTE — ED Triage Notes (Signed)
 Nausea/ vomiting/ diarrhea x 3 days. Taking Pepto.

## 2023-07-14 NOTE — ED Provider Notes (Signed)
 [facility]    CSN: [id number] Arrival date & time: 07/14/23  2841      History   Chief Complaint Chief Complaint  Patient presents with   Nausea   Diarrhea    HPI Toni Beard is a 63 y.o. female.   Patient presenting today with 3-day history of nausea, vomiting, diarrhea.  Denies fever, chills, upper respiratory symptoms, hematemesis, melena, new foods or medications, recent travel outside the country, known sick contacts.  So far trying Pepto-Bismol with minimal relief.  She does endorse starting a GLP-1 medication about 9 weeks ago but states she has been doing well without significant side effects.     Past Medical History:  Diagnosis Date   Allergy    seasonal   Contraceptive management 01/12/2013   Hypertension    Obesity     Patient Active Problem List   Diagnosis Date Noted   Sebaceous cyst of labia 05/04/2021   Encounter for screening fecal occult blood testing 05/04/2021   Encounter for routine adult physical exam with abnormal findings 05/04/2021   Postmenopause 05/04/2021   Varicose veins of both lower extremities 06/28/2016   Tinnitus 04/02/2015   Class 3 severe obesity with serious comorbidity and body mass index (BMI) of 40.0 to 44.9 in adult 05/17/2014   Seasonal allergies    Carcinoma in situ of cervix uteri 02/06/2013   Hypertension 01/12/2013    Past Surgical History:  Procedure Laterality Date   CERVICAL CONIZATION W/BX N/A 03/14/2013   Procedure: CONIZATION CERVIX ;  Surgeon: Wendelyn Halter, MD;  Location: [facility];  Service: Gynecology;  Laterality: N/A;   HOLMIUM LASER APPLICATION N/A 03/14/2013   Procedure: HOLMIUM LASER APPLICATION;  Surgeon: Wendelyn Halter, MD;  Location: [facility];  Service: Gynecology;  Laterality: N/A;    OB History     Gravida  1   Para  1   Term      Preterm      AB      Living  1      SAB      IAB      Ectopic      Multiple      Live Births  1            Home Medications     Prior to Admission medications   Medication Sig Start Date End Date Taking? Authorizing Provider  b complex vitamins tablet Take 1 tablet by mouth daily.   Yes [provider]  calcium carbonate (OS-CAL) 600 MG TABS tablet Take 600 mg by mouth daily with breakfast.   Yes [provider]  hydrochlorothiazide  (MICROZIDE ) 12.5 MG capsule Take 1 capsule by mouth once daily 06/22/23  Yes Del Orbe Polanco, Iliana, FNP  ondansetron  (ZOFRAN -ODT) 4 MG disintegrating tablet Take 1 tablet (4 mg total) by mouth every 8 (eight) hours as needed for nausea or vomiting. 07/14/23  Yes Corbin Dess, PA-C    Family History Family History  Problem Relation Age of Onset   Diabetes Mother    Hypertension Mother    Heart disease Mother    Arthritis Mother    Hyperlipidemia Mother    Hypertension Sister    Cancer Sister        kidney,lung   Depression Sister    Miscarriages / Stillbirths Sister    Hypertension Brother    Cancer Brother        prostate   Heart disease Brother    Vision loss  Maternal Grandfather    Early death Father    Hearing loss Brother    Learning disabilities Brother     Social History Social History   Tobacco Use   Smoking status: Never   Smokeless tobacco: Never  Vaping Use   Vaping status: Never Used  Substance Use Topics   Alcohol use: Yes    Alcohol/week: 0.0 standard drinks of alcohol    Comment: occasionally    Drug use: No     Allergies   Sulfa antibiotics   Review of Systems Review of Systems Per HPI  Physical Exam Triage Vital Signs ED Triage Vitals  Encounter Vitals Group     BP 07/14/23 0828 127/81     Systolic BP Percentile --      Diastolic BP Percentile --      Pulse Rate 07/14/23 0828 97     Resp 07/14/23 0828 18     Temp 07/14/23 0828 98 F (36.7 C)     Temp Source 07/14/23 0828 Oral     SpO2 07/14/23 0828 96 %     Weight --      Height --      Head Circumference --      Peak Flow --      Pain Score  07/14/23 0829 2     Pain Loc --      Pain Education --      Exclude from Growth Chart --    No data found.  Updated Vital Signs BP 127/81 (BP Location: Right Arm)   Pulse 97   Temp 98 F (36.7 C) (Oral)   Resp 18   SpO2 96%   Visual Acuity Right Eye Distance:   Left Eye Distance:   Bilateral Distance:    Right Eye Near:   Left Eye Near:    Bilateral Near:     Physical Exam Vitals and nursing note reviewed.  Constitutional:      Appearance: Normal appearance. She is not ill-appearing.  HENT:     Head: Atraumatic.     Mouth/Throat:     Mouth: Mucous membranes are moist.  Eyes:     Extraocular Movements: Extraocular movements intact.     Conjunctiva/sclera: Conjunctivae normal.  Cardiovascular:     Rate and Rhythm: Normal rate and regular rhythm.     Heart sounds: Normal heart sounds.  Pulmonary:     Effort: Pulmonary effort is normal.     Breath sounds: Normal breath sounds.  Abdominal:     General: Bowel sounds are normal. There is no distension.     Palpations: Abdomen is soft.     Tenderness: There is no abdominal tenderness. There is no right CVA tenderness, left CVA tenderness or guarding.  Musculoskeletal:        General: Normal range of motion.     Cervical back: Normal range of motion and neck supple.  Skin:    General: Skin is warm and dry.  Neurological:     Mental Status: She is alert and oriented to person, place, and time.  Psychiatric:        Mood and Affect: Mood normal.        Thought Content: Thought content normal.        Judgment: Judgment normal.      UC Treatments / Results  Labs (all labs ordered are listed, but only abnormal results are displayed) Labs Reviewed - No data to display  EKG   Radiology No results found.  Procedures  Procedures (including critical care time)  Medications Ordered in UC Medications  ondansetron  (ZOFRAN -ODT) disintegrating tablet 4 mg (4 mg Oral Given 07/14/23 0858)    Initial Impression /  Assessment and Plan / UC Course  I have reviewed the triage vital signs and the nursing notes.  Pertinent labs & imaging results that were available during my care of the patient were reviewed by me and considered in my medical decision making (see chart for details).     Vitals and exam reassuring today with no red flag findings.  Suspect viral GI illness versus possibly GLP-1 side effects.  Zofran  given, brat diet and fluids recommended and follow-up for ongoing symptoms.  Final Clinical Impressions(s) / UC Diagnoses   Final diagnoses:  Nausea vomiting and diarrhea   Discharge Instructions   None    ED Prescriptions     Medication Sig Dispense Auth. Provider   ondansetron  (ZOFRAN -ODT) 4 MG disintegrating tablet Take 1 tablet (4 mg total) by mouth every 8 (eight) hours as needed for nausea or vomiting. 20 tablet Corbin Dess, [place]      PDMP not reviewed this encounter.   Corbin Dess, [place] 07/14/23 1126

## 2023-07-20 ENCOUNTER — Other Ambulatory Visit: Payer: Self-pay | Admitting: Family Medicine

## 2023-07-20 DIAGNOSIS — I10 Essential (primary) hypertension: Secondary | ICD-10-CM

## 2023-08-03 ENCOUNTER — Ambulatory Visit: Payer: Self-pay | Admitting: Family Medicine

## 2023-08-18 ENCOUNTER — Other Ambulatory Visit: Payer: Self-pay | Admitting: Family Medicine

## 2023-08-18 DIAGNOSIS — I10 Essential (primary) hypertension: Secondary | ICD-10-CM

## 2023-09-09 ENCOUNTER — Ambulatory Visit: Admitting: Family Medicine

## 2023-09-09 ENCOUNTER — Encounter: Payer: Self-pay | Admitting: Family Medicine

## 2023-09-09 VITALS — BP 126/70 | HR 92 | Ht 66.0 in | Wt 257.0 lb

## 2023-09-09 DIAGNOSIS — I10 Essential (primary) hypertension: Secondary | ICD-10-CM

## 2023-09-09 DIAGNOSIS — R7303 Prediabetes: Secondary | ICD-10-CM | POA: Diagnosis not present

## 2023-09-09 DIAGNOSIS — R0602 Shortness of breath: Secondary | ICD-10-CM

## 2023-09-09 DIAGNOSIS — E038 Other specified hypothyroidism: Secondary | ICD-10-CM

## 2023-09-09 DIAGNOSIS — E559 Vitamin D deficiency, unspecified: Secondary | ICD-10-CM

## 2023-09-09 DIAGNOSIS — Z1231 Encounter for screening mammogram for malignant neoplasm of breast: Secondary | ICD-10-CM

## 2023-09-09 DIAGNOSIS — Z9189 Other specified personal risk factors, not elsewhere classified: Secondary | ICD-10-CM

## 2023-09-09 MED ORDER — HYDROCHLOROTHIAZIDE 12.5 MG PO CAPS: 12.5000 mg | ORAL_CAPSULE | Freq: Every day | ORAL | 1 refills | Status: DC

## 2023-09-09 NOTE — Patient Instructions (Signed)

## 2023-09-09 NOTE — Assessment & Plan Note (Signed)
 Controlled, Continue hydrochlorothiazide  12.5 mg once daily Labs ordered. Discussed with  patient to monitor their blood pressure regularly and maintain a heart-healthy diet rich in fruits, vegetables, whole grains, and low-fat dairy, while reducing sodium intake to less than 2,300 mg per day. Regular physical activity, such as 30 minutes of moderate exercise most days of the week, will help lower blood pressure and improve overall cardiovascular health. Avoiding smoking, limiting alcohol consumption, and managing stress. Take  prescribed medication, & take it as directed and avoid skipping doses. Seek emergency care if your blood pressure is (over 180/100) or you experience chest pain, shortness of breath, or sudden vision changes.Patient verbalizes understanding regarding plan of care and all questions answered.

## 2023-09-09 NOTE — Assessment & Plan Note (Signed)
 Stop bang score 4 points- High risk of OSA Referral placed to sleep studies

## 2023-09-09 NOTE — Progress Notes (Signed)
 Established Patient Office Visit   Subjective  Patient ID: Toni Beard, female    DOB: 1960/12/20  Age: 63 y.o. MRN: [id number]  Chief Complaint  Patient presents with   Hypertension    Follow up    She  has a past medical history of Allergy, Contraceptive management (01/12/2013), Hypertension, and Obesity.  HPI Patient presents to the clinic for hypertension follow up. For the details of today's visit, please refer to assessment and plan.   Review of Systems  Constitutional:  Negative for chills and fever.  Eyes:  Negative for blurred vision.  Cardiovascular:  Negative for chest pain.  Gastrointestinal:  Negative for abdominal pain.  Genitourinary:  Negative for dysuria.  Neurological:  Negative for dizziness and headaches.      Objective:     BP 126/70   Pulse 92   Ht 5' 6 (1.676 m)   Wt 257 lb (116.6 kg)   SpO2 96%   BMI 41.48 kg/m  BP Readings from Last 3 Encounters:  09/09/23 126/70  07/14/23 127/81  08/02/22 138/85      Physical Exam Vitals reviewed.  Constitutional:      General: She is not in acute distress.    Appearance: Normal appearance. She is not ill-appearing, toxic-appearing or diaphoretic.  HENT:     Head: Normocephalic.  Eyes:     General:        Right eye: No discharge.        Left eye: No discharge.     Conjunctiva/sclera: Conjunctivae normal.  Cardiovascular:     Rate and Rhythm: Normal rate.     Pulses: Normal pulses.     Heart sounds: Normal heart sounds.  Pulmonary:     Effort: Pulmonary effort is normal. No respiratory distress.     Breath sounds: Normal breath sounds.  Skin:    General: Skin is warm and dry.  Neurological:     Mental Status: She is alert.  Psychiatric:        Mood and Affect: Mood normal.        Behavior: Behavior normal.        Thought Content: Thought content normal.      No results found for any visits on 09/09/23.  The 10-year ASCVD risk score (Arnett DK, et al., 2019) is: 5.3%     Assessment & Plan:  Prediabetes -     Hemoglobin A1c  Vitamin D deficiency -     VITAMIN D 25 Hydroxy (Vit-D Deficiency, Fractures)  Primary hypertension Assessment & Plan: Controlled, Continue hydrochlorothiazide  12.5 mg once daily Labs ordered. Discussed with  patient to monitor their blood pressure regularly and maintain a heart-healthy diet rich in fruits, vegetables, whole grains, and low-fat dairy, while reducing sodium intake to less than 2,300 mg per day. Regular physical activity, such as 30 minutes of moderate exercise most days of the week, will help lower blood pressure and improve overall cardiovascular health. Avoiding smoking, limiting alcohol consumption, and managing stress. Take  prescribed medication, & take it as directed and avoid skipping doses. Seek emergency care if your blood pressure is (over 180/100) or you experience chest pain, shortness of breath, or sudden vision changes.Patient verbalizes understanding regarding plan of care and all questions answered.   Orders: -     Lipid panel -     CMP14+EGFR -     CBC with Differential/Platelet -     hydroCHLOROthiazide ; Take 1 capsule (12.5 mg total) by mouth daily.  Dispense:  90 capsule; Refill: 1  TSH (thyroid-stimulating hormone deficiency) -     TSH + free T4  SOB (shortness of breath) -     DG Chest 2 View; Future  At risk for sleep apnea Assessment & Plan: Stop bang score 4 points- High risk of OSA Referral placed to sleep studies   Orders: -     Ambulatory referral to Sleep Studies  Screening mammogram for breast cancer -     3D Screening Mammogram, Left and Right; Future    Return in about 6 months (around 03/11/2024), or if symptoms worsen or fail to improve, for pre-diabetes, hypertension, Front Office: Please schedule Mammogram.   Hilario Terry Wilhelmena Lloyd, FNP

## 2023-09-13 LAB — CBC WITH DIFFERENTIAL/PLATELET
Basophils Absolute: 0 x10E3/uL (ref 0.0–0.2)
Basos: 0 %
EOS (ABSOLUTE): 0.2 x10E3/uL (ref 0.0–0.4)
Eos: 2 %
Hematocrit: 44 % (ref 34.0–46.6)
Hemoglobin: 14.3 g/dL (ref 11.1–15.9)
Immature Grans (Abs): 0 x10E3/uL (ref 0.0–0.1)
Immature Granulocytes: 0 %
Lymphocytes Absolute: 2.6 x10E3/uL (ref 0.7–3.1)
Lymphs: 26 %
MCH: 30.4 pg (ref 26.6–33.0)
MCHC: 32.5 g/dL (ref 31.5–35.7)
MCV: 93 fL (ref 79–97)
Monocytes Absolute: 0.5 x10E3/uL (ref 0.1–0.9)
Monocytes: 5 %
Neutrophils Absolute: 6.8 x10E3/uL (ref 1.4–7.0)
Neutrophils: 67 %
Platelets: 326 x10E3/uL (ref 150–450)
RBC: 4.71 x10E6/uL (ref 3.77–5.28)
RDW: 12.8 % (ref 11.7–15.4)
WBC: 10.2 x10E3/uL (ref 3.4–10.8)

## 2023-09-13 LAB — CMP14+EGFR
ALT: 14 IU/L (ref 0–32)
AST: 17 IU/L (ref 0–40)
Albumin: 4.1 g/dL (ref 3.9–4.9)
Alkaline Phosphatase: 104 IU/L (ref 44–121)
BUN/Creatinine Ratio: 12 (ref 12–28)
BUN: 10 mg/dL (ref 8–27)
Bilirubin Total: 0.5 mg/dL (ref 0.0–1.2)
CO2: 20 mmol/L (ref 20–29)
Calcium: 9.7 mg/dL (ref 8.7–10.3)
Chloride: 102 mmol/L (ref 96–106)
Creatinine, Ser: 0.84 mg/dL (ref 0.57–1.00)
Globulin, Total: 2.4 g/dL (ref 1.5–4.5)
Glucose: 82 mg/dL (ref 70–99)
Potassium: 4.1 mmol/L (ref 3.5–5.2)
Sodium: 142 mmol/L (ref 134–144)
Total Protein: 6.5 g/dL (ref 6.0–8.5)
eGFR: 79 mL/min/1.73

## 2023-09-13 LAB — LIPID PANEL
Chol/HDL Ratio: 3.4 ratio (ref 0.0–4.4)
Cholesterol, Total: 170 mg/dL (ref 100–199)
HDL: 50 mg/dL
LDL Chol Calc (NIH): 100 mg/dL — ABNORMAL HIGH (ref 0–99)
Triglycerides: 110 mg/dL (ref 0–149)
VLDL Cholesterol Cal: 20 mg/dL (ref 5–40)

## 2023-09-13 LAB — TSH+FREE T4
Free T4: 0.98 ng/dL (ref 0.82–1.77)
TSH: 4.53 u[IU]/mL — ABNORMAL HIGH (ref 0.450–4.500)

## 2023-09-13 LAB — HEMOGLOBIN A1C
Est. average glucose Bld gHb Est-mCnc: 108 mg/dL
Hgb A1c MFr Bld: 5.4 % (ref 4.8–5.6)

## 2023-09-13 LAB — VITAMIN D 25 HYDROXY (VIT D DEFICIENCY, FRACTURES): Vit D, 25-Hydroxy: 36 ng/mL (ref 30.0–100.0)

## 2023-09-15 ENCOUNTER — Other Ambulatory Visit: Payer: Self-pay | Admitting: Family Medicine

## 2023-09-15 ENCOUNTER — Ambulatory Visit: Payer: Self-pay | Admitting: Family Medicine

## 2023-09-15 DIAGNOSIS — E038 Other specified hypothyroidism: Secondary | ICD-10-CM

## 2023-09-23 ENCOUNTER — Ambulatory Visit (HOSPITAL_COMMUNITY)
Admission: RE | Admit: 2023-09-23 | Discharge: 2023-09-23 | Disposition: A | Discharge status: Home / Self Care | Source: Ambulatory Visit | Attending: Family Medicine | Admitting: Family Medicine

## 2023-09-23 DIAGNOSIS — R0602 Shortness of breath: Secondary | ICD-10-CM | POA: Insufficient documentation

## 2023-10-03 ENCOUNTER — Encounter (HOSPITAL_COMMUNITY): Payer: Self-pay

## 2023-10-03 ENCOUNTER — Ambulatory Visit (HOSPITAL_COMMUNITY)
Admission: RE | Admit: 2023-10-03 | Discharge: 2023-10-03 | Disposition: A | Discharge status: Home / Self Care | Source: Ambulatory Visit | Attending: Family Medicine | Admitting: Family Medicine

## 2023-10-03 DIAGNOSIS — Z1231 Encounter for screening mammogram for malignant neoplasm of breast: Secondary | ICD-10-CM | POA: Insufficient documentation

## 2023-10-13 ENCOUNTER — Ambulatory Visit: Admitting: Neurology

## 2023-10-13 ENCOUNTER — Encounter: Payer: Self-pay | Admitting: Neurology

## 2023-10-13 VITALS — BP 120/86 | HR 88 | Ht 66.0 in | Wt 253.2 lb

## 2023-10-13 DIAGNOSIS — G4719 Other hypersomnia: Secondary | ICD-10-CM

## 2023-10-13 DIAGNOSIS — Z9189 Other specified personal risk factors, not elsewhere classified: Secondary | ICD-10-CM | POA: Diagnosis not present

## 2023-10-13 DIAGNOSIS — R0683 Snoring: Secondary | ICD-10-CM | POA: Diagnosis not present

## 2023-10-13 DIAGNOSIS — Z820 Family history of epilepsy and other diseases of the nervous system: Secondary | ICD-10-CM

## 2023-10-13 DIAGNOSIS — R351 Nocturia: Secondary | ICD-10-CM

## 2023-10-13 NOTE — Progress Notes (Signed)
 Subjective:    Patient ID: Toni Beard is a 63 y.o. female.  HPI    True Mar, MD, PhD [facility] [address] [address] [place], [place] [address]  Dear Toni Beard,  I saw your patient, Toni Beard, upon your kind request in my sleep clinic today for initial consultation of her sleep disorder, in particular, concern for underlying obstructive sleep apnea.  The patient is unaccompanied today.  As you know, Toni Beard is a 63 year old female with an underlying medical history of arthritis affecting both knees, pre-diabetes, vitamin D  deficiency, hypertension, allergies, impaired thyroid function, and severe obesity with a BMI of over 40, who reports snoring and excessive daytime somnolence.  Her Epworth sleepiness score is 13 out of 24, fatigue severity score is 14 out of 63.  I reviewed your office note from 09/09/2023.  She has a family history of sleep apnea affecting her mom and brother.  She is not sure if she would be able to tolerate PAP therapy but is willing to get checked out for sleep apnea.  She has nocturia about twice per average night.  She is working on weight loss.  She may need bilateral knee surgeries eventually.  She works in Scientific laboratory technician.  She goes to bed between 10 and 11 and rise time is around 6.  She is divorced for many years, she has a grown son.  She currently has her niece and great nephew staying with her.  She does not watch TV in her bedroom.  She has 1 cat and 1 dog in the household.  She denies recurrent nocturnal or morning headaches.  She drinks caffeine in the form of soda, 2 cans/day.  She is a non-smoker.  Her Past Medical History Is Significant For: Past Medical History:  Diagnosis Date   Allergy    seasonal   Contraceptive management 01/12/2013   Hypertension    Obesity     Her Past Surgical History Is Significant For: Past Surgical History:  Procedure Laterality Date   CERVICAL CONIZATION W/BX N/A  03/14/2013   Procedure: CONIZATION CERVIX ;  Surgeon: Vonn VEAR Inch, MD;  Location: [facility];  Service: Gynecology;  Laterality: N/A;   HOLMIUM LASER APPLICATION N/A 03/14/2013   Procedure: HOLMIUM LASER APPLICATION;  Surgeon: Vonn VEAR Inch, MD;  Location: [facility];  Service: Gynecology;  Laterality: N/A;    Her Family History Is Significant For: Family History  Problem Relation Age of Onset   Diabetes Mother    Hypertension Mother    Heart disease Mother    Arthritis Mother    Hyperlipidemia Mother    Hypertension Sister    Cancer Sister        kidney,lung   Depression Sister    Miscarriages / Stillbirths Sister    Hypertension Brother    Cancer Brother        prostate   Heart disease Brother    Vision loss Maternal Grandfather    Early death Father    Hearing loss Brother    Learning disabilities Brother     Her Social History Is Significant For: Social History   Socioeconomic History   Marital status: Divorced    Spouse name: Not on file   Number of children: Not on file   Years of education: Not on file   Highest education level: Associate degree: occupational, Scientist, product/process development, or vocational program  Occupational History   Not on file  Tobacco Use   Smoking status:  Never   Smokeless tobacco: Never  Vaping Use   Vaping status: Never Used  Substance and Sexual Activity   Alcohol use: Yes    Alcohol/week: 0.0 standard drinks of alcohol    Comment: occasionally    Drug use: No   Sexual activity: Yes    Birth control/protection: Condom, Post-menopausal  Other Topics Concern   Not on file  Social History Narrative   Caffiene 2 sodas daily   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   Pets cats dogs. (Inside and outside).    Social Drivers of Corporate investment banker Strain: Low Risk  (09/08/2023)   Overall Financial Resource Strain (CARDIA)    Difficulty of Paying Living Expenses: Not very hard  Food Insecurity: No Food Insecurity (09/08/2023)    Hunger Vital Sign    Worried About Running Out of Food in the Last Year: Never true    Ran Out of Food in the Last Year: Never true  Transportation Needs: No Transportation Needs (09/08/2023)   PRAPARE - Administrator, Civil Service (Medical): No    Lack of Transportation (Non-Medical): No  Physical Activity: Insufficiently Active (09/08/2023)   Exercise Vital Sign    Days of Exercise per Week: 2 days    Minutes of Exercise per Session: 30 min  Stress: No Stress Concern Present (09/08/2023)   [employer] of Occupational Health - Occupational Stress Questionnaire    Feeling of Stress: Not at all  Social Connections: Moderately Isolated (09/08/2023)   Social Connection and Isolation Panel    Frequency of Communication with Friends and Family: Three times a week    Frequency of Social Gatherings with Friends and Family: Twice a week    Attends Religious Services: More than 4 times per year    Active Member of [employer] or Organizations: No    Attends Engineer, structural: Not on file    Marital Status: Divorced    Her Allergies Are:  Allergies  Allergen Reactions   Sulfa Antibiotics Nausea And Vomiting  :   Her Current Medications Are:  Outpatient Encounter Medications as of 10/13/2023  Medication Sig   b complex vitamins tablet Take 1 tablet by mouth daily.   calcium carbonate (OS-CAL) 600 MG TABS tablet Take 600 mg by mouth daily with breakfast.   hydrochlorothiazide  (MICROZIDE ) 12.5 MG capsule Take 1 capsule (12.5 mg total) by mouth daily.   [DISCONTINUED] ondansetron  (ZOFRAN -ODT) 4 MG disintegrating tablet Take 1 tablet (4 mg total) by mouth every 8 (eight) hours as needed for nausea or vomiting.   No facility-administered encounter medications on file as of 10/13/2023.  :   Review of Systems:  Out of a complete 14 point review of systems, all are reviewed and negative with the exception of these symptoms as listed below:  Review of Systems   Neurological:        Snoring,  non restorative sleep.  Dreams a lot.  ESS  13,  FSS 14.    Objective:  Neurological Exam  Physical Exam Physical Examination:   Vitals:   10/13/23 1509  BP: 120/86  Pulse: 88  SpO2: 98%    General Examination: The patient is a very pleasant 63 y.o. female in no acute distress. She appears well-developed and well-nourished and well groomed.   HEENT: Normocephalic, atraumatic, pupils are equal, round and reactive to light, extraocular tracking is good without limitation to gaze excursion or nystagmus noted. Hearing is grossly  intact. Face is symmetric with normal facial animation. Speech is clear with no dysarthria noted. There is no hypophonia. There is no lip, neck/head, jaw or voice tremor. Neck is supple with full range of passive and active motion. There are no carotid bruits on auscultation. Oropharynx exam reveals: mild mouth dryness, adequate dental hygiene and moderate airway crowding, due to small airway, Mallampati class III, tonsils on the smaller side, tongue protrudes centrally and palate elevates symmetrically, neck circumference 15-5/8 inches, mild to moderate overbite noted.  Chest: Clear to auscultation without wheezing, rhonchi or crackles noted.  Heart: S1+S2+0, regular and normal without murmurs, rubs or gallops noted.   Abdomen: Soft, non-tender and non-distended.  Extremities: There is no obvious edema, is wearing knee-high compression socks.  Bilateral knee braces in place as well.   Skin: Warm and dry without trophic changes noted.   Musculoskeletal: exam reveals no obvious joint deformities, bilateral knee pain and knee brace is in place.   Neurologically:  Mental status: The patient is awake, alert and oriented in all 4 spheres. Her immediate and remote memory, attention, language skills and fund of knowledge are appropriate. There is no evidence of aphasia, agnosia, apraxia or anomia. Speech is clear with normal prosody and  enunciation. Thought process is linear. Mood is normal and affect is normal.  Cranial nerves II - XII are as described above under HEENT exam.  Motor exam: Normal bulk, strength and tone is noted. There is no obvious action or resting tremor.  Fine motor skills and coordination: grossly intact.  Cerebellar testing: No dysmetria or intention tremor. There is no truncal or gait ataxia.  Sensory exam: intact to light touch in the upper and lower extremities.  Gait, station and balance: She stands with mild difficulty, she walks with a cane.   Assessment and Plan:  In summary, Toni Beard is a very pleasant 63 y.o.-year old female with an underlying medical history of arthritis affecting both knees, pre-diabetes, vitamin D  deficiency, hypertension, allergies, impaired thyroid function, and severe obesity with a BMI of over 40, whose history and physical exam are concerning for sleep disordered breathing, particularly obstructive sleep apnea (OSA). A laboratory attended sleep study is typically considered gold standard for evaluation of sleep disordered breathing.   I had a long chat with the patient about my findings and the diagnosis of sleep apnea, particularly OSA, its prognosis and treatment options. We talked about medical/conservative treatments, surgical interventions and non-pharmacological approaches for symptom control. I explained, in particular, the risks and ramifications of untreated moderate to severe OSA, especially with respect to developing cardiovascular disease down the road, including congestive heart failure (CHF), difficult to treat hypertension, cardiac arrhythmias (particularly A-fib), neurovascular complications including TIA, stroke and dementia. Even type 2 diabetes has, in part, been linked to untreated OSA. Symptoms of untreated OSA may include (but may not be limited to) daytime sleepiness, nocturia (i.e. frequent nighttime urination), memory problems, mood irritability and  suboptimally controlled or worsening mood disorder such as depression and/or anxiety, lack of energy, lack of motivation, physical discomfort, as well as recurrent headaches, especially morning or nocturnal headaches. We talked about the importance of maintaining a healthy lifestyle and striving for healthy weight.  In addition, we talked about the importance of striving for and maintaining good sleep hygiene. I recommended a sleep study at this time. I outlined the differences between a laboratory attended sleep study which is considered more comprehensive and accurate over the option of a home sleep test (  HST); the latter may lead to underestimation of sleep disordered breathing in some instances and does not help with diagnosing upper airway resistance syndrome and is not accurate enough to diagnose primary central sleep apnea typically. I outlined possible surgical and non-surgical treatment options of OSA, including the use of a positive airway pressure (PAP) device (i.e. CPAP, AutoPAP/APAP or BiPAP in certain circumstances), a custom-made dental device (aka oral appliance, which would require a referral to a specialist dentist or orthodontist typically, and is generally speaking not considered for patients with full dentures or edentulous state), upper airway surgical options, such as traditional UPPP (which is not considered a first-line treatment) or the Inspire device (hypoglossal nerve stimulator, which would involve a referral for consultation with an ENT surgeon, after careful selection, following inclusion criteria - also not first-line treatment). I explained the PAP treatment option to the patient in detail, as this is generally considered first-line treatment.  The patient indicated that she would be willing to try PAP therapy, if the need arises. I explained the importance of being compliant with PAP treatment, not only for insurance purposes but primarily to improve patient's symptoms symptoms,  and for the patient's long term health benefit, including to reduce Her cardiovascular risks longer-term.    We will pick up our discussion about the next steps and treatment options after testing.  We will keep her posted as to the test results by phone call and/or MyChart messaging where possible.  We will plan to follow-up in sleep clinic accordingly as well.  I answered all her questions today and the patient was in agreement.   I encouraged her to call with any interim questions, concerns, problems or updates or email us  through MyChart.  Generally speaking, sleep test authorizations may take up to 2 weeks, sometimes less, sometimes longer, the patient is encouraged to get in touch with us  if they do not hear back from the sleep lab staff directly within the next 2 weeks.  Thank you very much for allowing me to participate in the care of this nice patient. If I can be of any further assistance to you please do not hesitate to call me at [phone].  Sincerely,   True Mar, MD, PhD

## 2023-10-13 NOTE — Patient Instructions (Signed)

## 2023-10-20 ENCOUNTER — Telehealth: Payer: Self-pay | Admitting: Neurology

## 2023-10-20 NOTE — Telephone Encounter (Signed)
 [employer] [facility] [employer] no auth req via fax

## 2023-10-20 NOTE — Telephone Encounter (Signed)
 [employer] [facility] [employer] pending

## 2023-10-26 NOTE — Telephone Encounter (Signed)
 I spoke with the patient.  [employer] no auth req via fax.  Patient is scheduled at [facility] for 11/15/23 at 9 pm.  Mailed packet and sent mychart.

## 2023-11-15 ENCOUNTER — Ambulatory Visit (INDEPENDENT_AMBULATORY_CARE_PROVIDER_SITE_OTHER): Admitting: Neurology

## 2023-11-15 DIAGNOSIS — G4733 Obstructive sleep apnea (adult) (pediatric): Secondary | ICD-10-CM

## 2023-11-15 DIAGNOSIS — G4720 Circadian rhythm sleep disorder, unspecified type: Secondary | ICD-10-CM

## 2023-11-15 DIAGNOSIS — R351 Nocturia: Secondary | ICD-10-CM

## 2023-11-15 DIAGNOSIS — R9431 Abnormal electrocardiogram [ECG] [EKG]: Secondary | ICD-10-CM

## 2023-11-15 DIAGNOSIS — R0683 Snoring: Secondary | ICD-10-CM

## 2023-11-15 DIAGNOSIS — Z9189 Other specified personal risk factors, not elsewhere classified: Secondary | ICD-10-CM

## 2023-11-15 DIAGNOSIS — G4734 Idiopathic sleep related nonobstructive alveolar hypoventilation: Secondary | ICD-10-CM

## 2023-11-15 DIAGNOSIS — Z820 Family history of epilepsy and other diseases of the nervous system: Secondary | ICD-10-CM

## 2023-11-15 DIAGNOSIS — G4719 Other hypersomnia: Secondary | ICD-10-CM

## 2023-11-22 NOTE — Procedures (Unsigned)
 Physician Interpretation: Please see link under Procedure Tab or under Encounters tab for physician report, technical report, as well as O2 titration and/or PAP titration tables (if applicable).   Referred by: Dr. Modena Callander   History and Indication for Testing (obtained from visit note dated 08/25/2023): 63 year old female with an underlying complex medical history of stroke in May 2025, hypertension, hyperlipidemia, coronary artery disease, prior smoking, COPD, history of kidney stone, carotid artery disease with status post bilateral carotid endarterectomies, hypothyroidism, and overweight state, who reports snoring and nocturia. His Epworth sleepiness score is 3 out of 24, fatigue severity score is 15 out of 63.     Review of the EEG showed no abnormal electrical discharges and symmetrical bihemispheric findings.     EKG: The EKG revealed normal sinus rhythm (NSR). ***   AUDIO/VIDEO REVIEW: The audio and video review did not show any abnormal or unusual behaviors, movements, phonations or vocalizations. The patient took *** restroom breaks. Snoring was noted, ***   POST-STUDY QUESTIONNAIRE: Post study, the patient indicated, that sleep was *** the same as usual.    IMPRESSION:    Obstructive Sleep Apnea (OSA), *** ***Central Sleep Apnea (CSA) ***Primary Snoring ***Primary Central Sleep Apnea ***Complex Sleep Apnea ***PLMD (periodic limb movement disorder [of sleep]) ***Dysfunctions associated with sleep stages or arousal from sleep ***Non-specific abnormal electrocardiogram (EKG) ***Poor sleep pattern ***Inconclusive Test   RECOMMENDATIONS:         I certify that I have reviewed the entire raw data recording prior to the issuance of this report in accordance with the Standards of Accreditation of the American Academy of Sleep Medicine (AASM).   True Mar, MD, PhD Medical Director, [facility]) Diplomat, ABPN (Neurology and  Sleep)

## 2023-11-23 ENCOUNTER — Ambulatory Visit: Payer: Self-pay | Admitting: Neurology

## 2023-11-23 DIAGNOSIS — G4733 Obstructive sleep apnea (adult) (pediatric): Secondary | ICD-10-CM

## 2023-12-05 NOTE — Telephone Encounter (Signed)
 SPOKE TO PT WHO STATED THAT ADAPT ISN'T IN NETWORK SO WILL TRY [facility]

## 2024-02-07 ENCOUNTER — Encounter: Admitting: Neurology

## 2024-02-27 ENCOUNTER — Telehealth: Payer: Self-pay | Admitting: Neurology

## 2024-02-27 NOTE — Telephone Encounter (Signed)
 Pt asked to cx, does not have CPAP yet

## 2024-02-28 ENCOUNTER — Encounter: Admitting: Neurology

## 2024-03-12 ENCOUNTER — Telehealth: Admitting: Family Medicine

## 2024-03-12 DIAGNOSIS — E559 Vitamin D deficiency, unspecified: Secondary | ICD-10-CM

## 2024-03-12 DIAGNOSIS — E66813 Obesity, class 3: Secondary | ICD-10-CM

## 2024-03-12 DIAGNOSIS — I10 Essential (primary) hypertension: Secondary | ICD-10-CM | POA: Diagnosis not present

## 2024-03-12 DIAGNOSIS — Z6841 Body Mass Index (BMI) 40.0 and over, adult: Secondary | ICD-10-CM | POA: Diagnosis not present

## 2024-03-12 DIAGNOSIS — E038 Other specified hypothyroidism: Secondary | ICD-10-CM

## 2024-03-12 DIAGNOSIS — R7301 Impaired fasting glucose: Secondary | ICD-10-CM | POA: Diagnosis not present

## 2024-03-12 DIAGNOSIS — E7849 Other hyperlipidemia: Secondary | ICD-10-CM

## 2024-03-12 MED ORDER — HYDROCHLOROTHIAZIDE 12.5 MG PO CAPS: 12.5000 mg | ORAL_CAPSULE | Freq: Every day | ORAL | 1 refills | Status: AC

## 2024-03-12 MED ORDER — TIRZEPATIDE-WEIGHT MANAGEMENT 2.5 MG/0.5ML ~~LOC~~ SOLN: 2.5000 mg | SUBCUTANEOUS | 0 refills | Status: AC

## 2024-03-12 NOTE — Assessment & Plan Note (Signed)
 Controlled Blood Pressure  The patients blood pressure is controlled in the clinic today. Encouraged to continue taking treatment regimen as is  The patient remains asymptomatic. Advised to maintain a low-sodium diet and increase physical activity as tolerated.

## 2024-03-12 NOTE — Assessment & Plan Note (Signed)
 Rx sent For optimal results with weight loss, I recommend:  Decreasing portion sizes. Reducing sugar, sodium, and carbohydrate intake, and limiting saturated fats in your diet. Increasing your fiber intake by incorporating more whole grains, fruits, and vegetables. Setting healthy goals and focusing on lowering carbs, sugar, and fat. Increasing the variety of fruits and vegetables in your diet. Reducing soda consumption and limiting processed foods. Encouraged engaging in moderate-intensity physical activity for at least 150 minutes per week for the best results.

## 2024-03-12 NOTE — Progress Notes (Signed)
 "  Virtual Visit via Video Note  I connected with YAREXI PAWLICKI on 03/12/24 at  9:00 AM EST by a video enabled telemedicine application and verified that I am speaking with the correct person using two identifiers.  Patient Location: Home Provider Location: Home Office  I discussed the limitations, risks, security, and privacy concerns of performing an evaluation and management service by video and the availability of in person appointments. I also discussed with the patient that there may be a patient responsible charge related to this service. The patient expressed understanding and agreed to proceed.  Subjective: PCP: Terry Wilhelmena Lloyd Hilario, FNP  Chief Complaint  Patient presents with   Medical Management of Chronic Issues    Six month follow up    HPI The patient presents today requesting a medication refill. She has a history of severe bilateral knee arthritis and has been advised that she will need surgical intervention for both knees, but requires weight loss prior to surgery. She ambulates with a cane due to knee pain and limited mobility. She is requesting a refill of Zepbound  (tirzepatide ) today to assist with weight loss management.   Wt Readings from Last 3 Encounters:  10/13/23 253 lb 3.2 oz (114.9 kg)  09/09/23 257 lb (116.6 kg)  08/02/22 269 lb (122 kg)     ROS: Per HPI Current Medications[1]  Observations/Objective: There were no vitals filed for this visit. Physical Exam Patient is well-developed, well-nourished in no acute distress.  Resting comfortably at home.  Head is normocephalic, atraumatic.  No labored breathing.  Speech is clear and coherent with logical content.  Patient is alert and oriented at baseline.   Assessment and Plan: Class 3 severe obesity due to excess calories with serious comorbidity and body mass index (BMI) of 40.0 to 44.9 in adult [facility]) Assessment & Plan: Rx sent For optimal results with weight loss, I recommend:  Decreasing  portion sizes. Reducing sugar, sodium, and carbohydrate intake, and limiting saturated fats in your diet. Increasing your fiber intake by incorporating more whole grains, fruits, and vegetables. Setting healthy goals and focusing on lowering carbs, sugar, and fat. Increasing the variety of fruits and vegetables in your diet. Reducing soda consumption and limiting processed foods. Encouraged engaging in moderate-intensity physical activity for at least 150 minutes per week for the best results.   Orders: -     Tirzepatide -Weight Management; Inject 2.5 mg into the skin once a week.  Dispense: 2 mL; Refill: 0  Primary hypertension Assessment & Plan: Controlled Blood Pressure  The patients blood pressure is controlled in the clinic today. Encouraged to continue taking treatment regimen as is  The patient remains asymptomatic. Advised to maintain a low-sodium diet and increase physical activity as tolerated.   Orders: -     hydroCHLOROthiazide ; Take 1 capsule (12.5 mg total) by mouth daily.  Dispense: 90 capsule; Refill: 1  IFG (impaired fasting glucose) -     Hemoglobin A1c  Vitamin D  deficiency -     VITAMIN D  25 Hydroxy (Vit-D Deficiency, Fractures)  TSH (thyroid-stimulating hormone deficiency) -     TSH + free T4  Other hyperlipidemia -     Lipid panel -     CMP14+EGFR -     CBC with Differential/Platelet    Follow Up Instructions: Return in about 5 months (around 08/10/2024).   I discussed the assessment and treatment plan with the patient. The patient was provided an opportunity to ask questions, and all were answered. The  patient agreed with the plan and demonstrated an understanding of the instructions.   The patient was advised to call back or seek an in-person evaluation if the symptoms worsen or if the condition fails to improve as anticipated.  The above assessment and management plan was discussed with the patient. The patient verbalized understanding of and has  agreed to the management plan.   Karel Turpen  Z Bacchus, FNP     [1]  Current Outpatient Medications:    tirzepatide  (ZEPBOUND ) 2.5 MG/0.5ML injection vial, Inject 2.5 mg into the skin once a week., Disp: 2 mL, Rfl: 0   b complex vitamins tablet, Take 1 tablet by mouth daily., Disp: , Rfl:    calcium carbonate (OS-CAL) 600 MG TABS tablet, Take 600 mg by mouth daily with breakfast., Disp: , Rfl:    hydrochlorothiazide  (MICROZIDE ) 12.5 MG capsule, Take 1 capsule (12.5 mg total) by mouth daily., Disp: 90 capsule, Rfl: 1  "

## 2024-03-16 ENCOUNTER — Ambulatory Visit: Payer: Self-pay

## 2024-03-16 DIAGNOSIS — E66813 Obesity, class 3: Secondary | ICD-10-CM | POA: Diagnosis not present

## 2024-03-16 NOTE — Progress Notes (Addendum)
 Patient is in office today for a nurse visit for Weight Check. Patient's current weight is 247.04lbs

## 2024-03-17 LAB — CBC WITH DIFFERENTIAL/PLATELET
Basophils Absolute: 0 10*3/uL (ref 0.0–0.2)
Basos: 0 %
EOS (ABSOLUTE): 0.2 10*3/uL (ref 0.0–0.4)
Eos: 3 %
Hematocrit: 43.7 % (ref 34.0–46.6)
Hemoglobin: 14.4 g/dL (ref 11.1–15.9)
Immature Grans (Abs): 0 10*3/uL (ref 0.0–0.1)
Immature Granulocytes: 0 %
Lymphocytes Absolute: 2.5 10*3/uL (ref 0.7–3.1)
Lymphs: 32 %
MCH: 30.6 pg (ref 26.6–33.0)
MCHC: 33 g/dL (ref 31.5–35.7)
MCV: 93 fL (ref 79–97)
Monocytes Absolute: 0.4 10*3/uL (ref 0.1–0.9)
Monocytes: 5 %
Neutrophils Absolute: 4.5 10*3/uL (ref 1.4–7.0)
Neutrophils: 60 %
Platelets: 312 10*3/uL (ref 150–450)
RBC: 4.71 x10E6/uL (ref 3.77–5.28)
RDW: 12.5 % (ref 11.7–15.4)
WBC: 7.6 10*3/uL (ref 3.4–10.8)

## 2024-03-17 LAB — VITAMIN D 25 HYDROXY (VIT D DEFICIENCY, FRACTURES): Vit D, 25-Hydroxy: 26.9 ng/mL — ABNORMAL LOW (ref 30.0–100.0)

## 2024-03-17 LAB — LIPID PANEL
Chol/HDL Ratio: 3.4 ratio (ref 0.0–4.4)
Cholesterol, Total: 181 mg/dL (ref 100–199)
HDL: 54 mg/dL
LDL Chol Calc (NIH): 107 mg/dL — ABNORMAL HIGH (ref 0–99)
Triglycerides: 109 mg/dL (ref 0–149)
VLDL Cholesterol Cal: 20 mg/dL (ref 5–40)

## 2024-03-17 LAB — CMP14+EGFR
ALT: 12 [IU]/L (ref 0–32)
AST: 15 [IU]/L (ref 0–40)
Albumin: 4.2 g/dL (ref 3.9–4.9)
Alkaline Phosphatase: 106 [IU]/L (ref 49–135)
BUN/Creatinine Ratio: 15 (ref 12–28)
BUN: 11 mg/dL (ref 8–27)
Bilirubin Total: 0.9 mg/dL (ref 0.0–1.2)
CO2: 26 mmol/L (ref 20–29)
Calcium: 9.6 mg/dL (ref 8.7–10.3)
Chloride: 100 mmol/L (ref 96–106)
Creatinine, Ser: 0.73 mg/dL (ref 0.57–1.00)
Globulin, Total: 2.4 g/dL (ref 1.5–4.5)
Glucose: 88 mg/dL (ref 70–99)
Potassium: 3.7 mmol/L (ref 3.5–5.2)
Sodium: 141 mmol/L (ref 134–144)
Total Protein: 6.6 g/dL (ref 6.0–8.5)
eGFR: 93 mL/min/{1.73_m2}

## 2024-03-17 LAB — TSH+FREE T4
Free T4: 1.01 ng/dL (ref 0.82–1.77)
TSH: 3.75 u[IU]/mL (ref 0.450–4.500)

## 2024-03-17 LAB — HEMOGLOBIN A1C
Est. average glucose Bld gHb Est-mCnc: 103 mg/dL
Hgb A1c MFr Bld: 5.2 % (ref 4.8–5.6)

## 2024-03-28 ENCOUNTER — Other Ambulatory Visit: Payer: Self-pay | Admitting: Family Medicine

## 2024-03-28 DIAGNOSIS — E559 Vitamin D deficiency, unspecified: Secondary | ICD-10-CM

## 2024-03-28 MED ORDER — VITAMIN D (ERGOCALCIFEROL) 1.25 MG (50000 UNIT) PO CAPS: 50000.0000 [IU] | ORAL_CAPSULE | ORAL | 1 refills | Status: AC
